# Patient Record
Sex: Female | Born: 1960 | Race: White | Hispanic: No | Marital: Married | Smoking: Never smoker
Health system: Southern US, Community
[De-identification: ages and names within clinical notes are randomized; demographics above are authoritative.]

## PROBLEM LIST (undated history)

## (undated) DIAGNOSIS — T8859XA Other complications of anesthesia, initial encounter: Secondary | ICD-10-CM

## (undated) DIAGNOSIS — Z8719 Personal history of other diseases of the digestive system: Secondary | ICD-10-CM

## (undated) DIAGNOSIS — E7800 Pure hypercholesterolemia, unspecified: Secondary | ICD-10-CM

## (undated) DIAGNOSIS — K5900 Constipation, unspecified: Secondary | ICD-10-CM

## (undated) DIAGNOSIS — T4145XA Adverse effect of unspecified anesthetic, initial encounter: Secondary | ICD-10-CM

## (undated) DIAGNOSIS — G4733 Obstructive sleep apnea (adult) (pediatric): Principal | ICD-10-CM

## (undated) DIAGNOSIS — C439 Malignant melanoma of skin, unspecified: Secondary | ICD-10-CM

## (undated) DIAGNOSIS — S069X9A Unspecified intracranial injury with loss of consciousness of unspecified duration, initial encounter: Secondary | ICD-10-CM

## (undated) DIAGNOSIS — J189 Pneumonia, unspecified organism: Secondary | ICD-10-CM

## (undated) DIAGNOSIS — Z8489 Family history of other specified conditions: Secondary | ICD-10-CM

## (undated) DIAGNOSIS — R011 Cardiac murmur, unspecified: Secondary | ICD-10-CM

## (undated) DIAGNOSIS — H33319 Horseshoe tear of retina without detachment, unspecified eye: Secondary | ICD-10-CM

## (undated) DIAGNOSIS — R55 Syncope and collapse: Secondary | ICD-10-CM

## (undated) DIAGNOSIS — Z9889 Other specified postprocedural states: Secondary | ICD-10-CM

## (undated) DIAGNOSIS — J45909 Unspecified asthma, uncomplicated: Secondary | ICD-10-CM

## (undated) DIAGNOSIS — R112 Nausea with vomiting, unspecified: Secondary | ICD-10-CM

## (undated) HISTORY — DX: Pure hypercholesterolemia, unspecified: E78.00

## (undated) HISTORY — PX: NERVE TRANSFER: SHX2084

## (undated) HISTORY — DX: Syncope and collapse: R55

## (undated) HISTORY — DX: Obstructive sleep apnea (adult) (pediatric): G47.33

## (undated) HISTORY — DX: Malignant melanoma of skin, unspecified: C43.9

## (undated) HISTORY — DX: Unspecified asthma, uncomplicated: J45.909

## (undated) HISTORY — PX: OTHER SURGICAL HISTORY: SHX169

## (undated) HISTORY — PX: ABDOMINAL HYSTERECTOMY: SHX81

## (undated) HISTORY — PX: COLONOSCOPY W/ POLYPECTOMY: SHX1380

## (undated) HISTORY — PX: WRIST SURGERY: SHX841

## (undated) HISTORY — PX: SHOULDER SURGERY: SHX246

---

## 1998-09-18 ENCOUNTER — Other Ambulatory Visit: Admission: RE | Admit: 1998-09-18 | Discharge: 1998-09-18 | Payer: Self-pay | Admitting: Obstetrics and Gynecology

## 1998-10-20 ENCOUNTER — Other Ambulatory Visit: Admission: RE | Admit: 1998-10-20 | Discharge: 1998-10-20 | Payer: Self-pay | Admitting: Obstetrics and Gynecology

## 1999-04-12 ENCOUNTER — Encounter (INDEPENDENT_AMBULATORY_CARE_PROVIDER_SITE_OTHER): Payer: Self-pay

## 1999-04-12 ENCOUNTER — Other Ambulatory Visit: Admission: RE | Admit: 1999-04-12 | Discharge: 1999-04-12 | Payer: Self-pay | Admitting: Obstetrics and Gynecology

## 1999-05-11 ENCOUNTER — Other Ambulatory Visit: Admission: RE | Admit: 1999-05-11 | Discharge: 1999-05-11 | Payer: Self-pay | Admitting: Obstetrics and Gynecology

## 1999-05-11 ENCOUNTER — Encounter (INDEPENDENT_AMBULATORY_CARE_PROVIDER_SITE_OTHER): Payer: Self-pay | Admitting: Specialist

## 1999-08-26 ENCOUNTER — Other Ambulatory Visit: Admission: RE | Admit: 1999-08-26 | Discharge: 1999-08-26 | Payer: Self-pay | Admitting: Obstetrics and Gynecology

## 1999-09-24 ENCOUNTER — Encounter: Admission: RE | Admit: 1999-09-24 | Discharge: 1999-09-24 | Payer: Self-pay | Admitting: Obstetrics and Gynecology

## 1999-09-24 ENCOUNTER — Encounter: Payer: Self-pay | Admitting: Obstetrics and Gynecology

## 2000-03-09 ENCOUNTER — Other Ambulatory Visit: Admission: RE | Admit: 2000-03-09 | Discharge: 2000-03-09 | Payer: Self-pay | Admitting: Obstetrics and Gynecology

## 2000-08-25 ENCOUNTER — Other Ambulatory Visit: Admission: RE | Admit: 2000-08-25 | Discharge: 2000-08-25 | Payer: Self-pay | Admitting: Obstetrics and Gynecology

## 2001-02-21 ENCOUNTER — Other Ambulatory Visit: Admission: RE | Admit: 2001-02-21 | Discharge: 2001-02-21 | Payer: Self-pay | Admitting: Obstetrics and Gynecology

## 2002-04-02 ENCOUNTER — Other Ambulatory Visit: Admission: RE | Admit: 2002-04-02 | Discharge: 2002-04-02 | Payer: Self-pay | Admitting: Obstetrics and Gynecology

## 2002-04-05 ENCOUNTER — Encounter: Admission: RE | Admit: 2002-04-05 | Discharge: 2002-04-05 | Payer: Self-pay | Admitting: Obstetrics and Gynecology

## 2002-04-05 ENCOUNTER — Encounter: Payer: Self-pay | Admitting: Obstetrics and Gynecology

## 2003-04-09 ENCOUNTER — Other Ambulatory Visit: Admission: RE | Admit: 2003-04-09 | Discharge: 2003-04-09 | Payer: Self-pay | Admitting: Obstetrics and Gynecology

## 2004-04-04 ENCOUNTER — Ambulatory Visit (HOSPITAL_BASED_OUTPATIENT_CLINIC_OR_DEPARTMENT_OTHER): Admission: RE | Admit: 2004-04-04 | Discharge: 2004-04-04 | Payer: Self-pay | Admitting: Internal Medicine

## 2004-05-18 ENCOUNTER — Other Ambulatory Visit: Admission: RE | Admit: 2004-05-18 | Discharge: 2004-05-18 | Payer: Self-pay | Admitting: Obstetrics and Gynecology

## 2004-06-09 ENCOUNTER — Ambulatory Visit (HOSPITAL_COMMUNITY): Admission: RE | Admit: 2004-06-09 | Discharge: 2004-06-09 | Payer: Self-pay | Admitting: Endocrinology

## 2005-06-22 ENCOUNTER — Other Ambulatory Visit: Admission: RE | Admit: 2005-06-22 | Discharge: 2005-06-22 | Payer: Self-pay | Admitting: Obstetrics and Gynecology

## 2008-02-26 ENCOUNTER — Ambulatory Visit (HOSPITAL_COMMUNITY): Admission: RE | Admit: 2008-02-26 | Discharge: 2008-02-26 | Payer: Self-pay | Admitting: Obstetrics and Gynecology

## 2009-05-15 ENCOUNTER — Emergency Department (HOSPITAL_COMMUNITY): Admission: EM | Admit: 2009-05-15 | Discharge: 2009-05-15 | Payer: Self-pay | Admitting: Emergency Medicine

## 2010-09-07 LAB — URINALYSIS, ROUTINE W REFLEX MICROSCOPIC
Specific Gravity, Urine: 1.021 (ref 1.005–1.030)
Urobilinogen, UA: 0.2 mg/dL (ref 0.0–1.0)
pH: 6 (ref 5.0–8.0)

## 2010-09-07 LAB — URINE CULTURE

## 2010-09-07 LAB — URINE MICROSCOPIC-ADD ON

## 2010-10-22 NOTE — Procedures (Signed)
NAME:  Darlene Harvey, Darlene Harvey NO.:  [id number]   MEDICAL RECORD NO.:  [id number]          PATIENT TYPE:  OUT   LOCATION:  SLEEP CENTER                 FACILITY:  [facility]   PHYSICIAN:  Clinton D. Maple Hudson, M.D. DATE OF BIRTH:  1960/11/24   DATE OF STUDY:  04/04/2004                              NOCTURNAL POLYSOMNOGRAM   INDICATION FOR STUDY:  Insomnia with sleep apnea.  Multiple sleep  medications have been tried.   EPWORTH SLEEPINESS SCORE:  Epworth score 3/24, BMI 29, weight 150 pounds.   SLEEP ARCHITECTURE:  Total sleep time 339 minutes with a sleep efficiency of  85%.  Stage 1 was 6%, stage 2 was 72%, stages 3 and 4 were 3% and REM was  19% of total sleep time.  Latency to sleep onset 19 minutes, latency to REM  112 minutes.  Awake after sleep onset 38 minutes.  Arousal index 31.9 which  is increased.  Some awakenings were associated with limb jerks but most were  nonspecific.  She described the overall sleep as better than usual, and  estimated her length of sleep at 5 hours.  The patient took temazepam at  study onset.   RESPIRATORY DATA:  RDI 1.9 per hour reflecting 1 central apnea, 1  obstructive apnea and 9 hypopnea's.  This is within normal limits.  Oxygen  saturation was normal with a nadir of 94% and a mean oxygen saturation of  97% on room air. There was mild snoring.   CARDIAC DATA:  Normal cardiac rhythm.   MOVEMENT/PARASOMNIA:  59 limb jerks were recorded of which 8 were associated  with arousal or awakening for a periodic limb movement with a arousable  index of 1.4 per hours which was unremarkable.   IMPRESSION/RECOMMENDATION:  Unremarkable sleep time and architecture except  for somewhat increased nonspecific EEG arousals.  This invites the  possibility that there is sleep state misperception and that she  underestimates the quality of her sleep.  No significant sleep disorder  breathing. Occasionally a limb jerk may wake her. Suggest continued  management as insomnia.                                                           Clinton D. Maple Hudson, M.D.  Diplomate, [employer] Board   CDY/MEDQ  D:  04/11/2004 16:10:96  T:  04/11/2004 18:31:58  Job:  [id number]

## 2011-05-12 ENCOUNTER — Other Ambulatory Visit: Payer: Self-pay | Admitting: Gastroenterology

## 2011-05-12 DIAGNOSIS — R1013 Epigastric pain: Secondary | ICD-10-CM

## 2011-05-17 ENCOUNTER — Ambulatory Visit
Admission: RE | Admit: 2011-05-17 | Discharge: 2011-05-17 | Disposition: A | Discharge status: Home / Self Care | Payer: 59 | Source: Ambulatory Visit | Attending: Gastroenterology | Admitting: Gastroenterology

## 2011-05-17 DIAGNOSIS — R1013 Epigastric pain: Secondary | ICD-10-CM

## 2013-01-14 ENCOUNTER — Other Ambulatory Visit: Payer: Self-pay | Admitting: Obstetrics and Gynecology

## 2013-04-25 ENCOUNTER — Other Ambulatory Visit: Payer: Self-pay | Admitting: Obstetrics and Gynecology

## 2013-06-19 ENCOUNTER — Encounter (INDEPENDENT_AMBULATORY_CARE_PROVIDER_SITE_OTHER): Payer: Self-pay

## 2013-06-19 ENCOUNTER — Encounter: Payer: Self-pay | Admitting: Pulmonary Disease

## 2013-06-19 ENCOUNTER — Ambulatory Visit (INDEPENDENT_AMBULATORY_CARE_PROVIDER_SITE_OTHER): Payer: BC Managed Care – PPO | Admitting: Pulmonary Disease

## 2013-06-19 VITALS — BP 130/74 | HR 83 | Ht 60.0 in | Wt 145.4 lb

## 2013-06-19 DIAGNOSIS — R0989 Other specified symptoms and signs involving the circulatory and respiratory systems: Secondary | ICD-10-CM

## 2013-06-19 DIAGNOSIS — G479 Sleep disorder, unspecified: Secondary | ICD-10-CM

## 2013-06-19 DIAGNOSIS — R0683 Snoring: Secondary | ICD-10-CM

## 2013-06-19 DIAGNOSIS — R0609 Other forms of dyspnea: Secondary | ICD-10-CM

## 2013-06-19 NOTE — Patient Instructions (Signed)
Complete sleep diary Will arrange for sleep study Will call to arrange for follow up after sleep study reviewed

## 2013-06-19 NOTE — Progress Notes (Deleted)
   Subjective:    Patient ID: Darlene Harvey, female    DOB: 1961-03-15, 53 y.o.   MRN: [id number]  HPI    Review of Systems  Constitutional: Negative for fever and unexpected weight change.  HENT: Negative for congestion, dental problem, ear pain, nosebleeds, postnasal drip, rhinorrhea, sinus pressure, sneezing, sore throat and trouble swallowing.   Eyes: Negative for redness and itching.  Respiratory: Positive for chest tightness and shortness of breath. Negative for cough and wheezing.   Cardiovascular: Negative for palpitations and leg swelling.  Gastrointestinal: Negative for nausea and vomiting.  Genitourinary: Negative for dysuria.  Musculoskeletal: Negative for joint swelling.  Skin: Negative for rash.  Neurological: Positive for headaches.  Hematological: Does not bruise/bleed easily.  Psychiatric/Behavioral: Positive for dysphoric mood. The patient is nervous/anxious.        Objective:   Physical Exam        Assessment & Plan:

## 2013-06-19 NOTE — Progress Notes (Signed)
Chief Complaint  Patient presents with  . Sleep consult    Referred by Dr. Gaetano Net  Epworth score 8    History of Present Illness: Darlene Harvey is a 53 y.o. female for evaluation of sleep problems.  She has noticed trouble with her sleep for years.  This started after her youngest daughter was born 29 years ago.  She eventually was able to get into a better sleeping habit after years.  She then needed a hysterectomy with oophorectomy in August 2014 due to fibroids.  She started having trouble with her sleep again.  She was tried on estradiol, but this caused chest heaviness.  She had estradiol stopped, and her chest heaviness has improved.  She is to see cardiology in February 2015.  She can fall asleep easily.  She will sometimes fall asleep on the couch.  Once she is asleep she can wake up easily.  She takes ambien at 9 pm, and this helps.  She will sometimes wake up around 230 am, and take an additional 1/2 pill of ambien.  She has also been using melatonin at night, but this is not as effective as before.  She feels restless at night, and exhausted all day.  She has tried meditation.  She avoids working on computer or watching TV at night.  She will try to read if she as trouble falling back to sleep.  She gets out of bed at 6 am.  She gets hiccups at night, and will sometimes snore.  She will also talk in her sleep and clench her teeth at night.  She sometimes gets back discomfort while asleep.  She will drink a cup of coffee in the morning.  She has noticed feeling more depressed since her TAH with BSO.  She had a sleep study years ago (about 10 to 12 yrs), and was told she had mild sleep apnea but didn't need treatment..  She works as a Environmental education officer, and cares for a quadriplegic patient.  She works from 9 am to 7 pm Tuesday to Friday.  She used to be a professor in disability studies.  She has a history of exercise induced asthma.  She also has history of melanoma s/p excision at  multiple locations.  She denies sleep walking, or nightmares.  There is no history of restless legs.  She denies sleep hallucinations, sleep paralysis, or cataplexy.  The Epworth score is 8 out of 24.  Tests:   Darlene Harvey  has a past medical history of Asthma; High cholesterol; and Cancer.  Darlene Harvey  has past surgical history that includes Abdominal hysterectomy.  Prior to Admission medications   Medication Sig Start Date End Date Taking? Authorizing Provider  calcium carbonate 200 MG capsule Take 250 mg by mouth daily.   Yes Historical Provider, MD  Cholecalciferol (VITAMIN D-3) 5000 UNITS TABS Take 1 tablet by mouth daily.   Yes Historical Provider, MD  Melatonin-Pyridoxine (MELATONEX PO) Take 1 tablet by mouth daily.   Yes Historical Provider, MD    Not on File  Her family history includes Allergies in her mother; Asthma in her mother; Cancer in her mother; Cancer - Prostate in her father.  She  reports that she has never smoked. She does not have any smokeless tobacco history on file. She reports that she does not drink alcohol or use illicit drugs.   Physical Exam:  General - No distress ENT - No sinus tenderness, no oral exudate, no LAN, no thyromegaly, TM  clear, pupils equal/reactive Cardiac - s1s2 regular, no murmur, pulses symmetric Chest - No wheeze/rales/dullness, good air entry, normal respiratory excursion Back - No focal tenderness Abd - Soft, non-tender, no organomegaly, + bowel sounds Ext - No edema Neuro - Normal strength, cranial nerves intact Skin - No rashes Psych - Normal mood, and behavior  Assessment/plan:  Darlene Mires, MD [place] Pager:  [phone] After 3pm call: [phone]

## 2013-07-02 ENCOUNTER — Encounter: Payer: Self-pay | Admitting: Pulmonary Disease

## 2013-07-02 DIAGNOSIS — G4733 Obstructive sleep apnea (adult) (pediatric): Secondary | ICD-10-CM

## 2013-07-02 HISTORY — DX: Obstructive sleep apnea (adult) (pediatric): G47.33

## 2013-07-02 NOTE — Assessment & Plan Note (Signed)
This is likely multifactorial.  She does report snoring, sleep disruption, apnea, and daytime sleepiness.  These symptoms have progressed since she had TAH with BSO.  Certainly incidence of sleep disordered breathing can increase in setting of post-menopausal state.  To further assess will arrange for in lab sleep study.  She also reports teeth clenching and could have bruxism >> further assess during sleep study.  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  This could be from sleep apnea, also.  She is to continue ambien and melatonin for now.  Will have her keep a sleep diary too.  Further assessment will be done after review of her sleep study.  Explained also how depression can contribute to sleep difficulties >> advised her to d/w her PCP further about options to address her symptoms of depression.

## 2013-07-14 ENCOUNTER — Ambulatory Visit (HOSPITAL_BASED_OUTPATIENT_CLINIC_OR_DEPARTMENT_OTHER): Payer: BC Managed Care – PPO | Attending: Pulmonary Disease

## 2013-08-02 ENCOUNTER — Ambulatory Visit (HOSPITAL_BASED_OUTPATIENT_CLINIC_OR_DEPARTMENT_OTHER): Payer: BC Managed Care – PPO | Attending: Pulmonary Disease | Admitting: Radiology

## 2013-08-02 VITALS — Ht 60.0 in | Wt 145.0 lb

## 2013-08-02 DIAGNOSIS — R0683 Snoring: Secondary | ICD-10-CM

## 2013-08-02 DIAGNOSIS — G4730 Sleep apnea, unspecified: Secondary | ICD-10-CM | POA: Insufficient documentation

## 2013-08-04 ENCOUNTER — Telehealth: Payer: Self-pay | Admitting: Pulmonary Disease

## 2013-08-04 DIAGNOSIS — R0989 Other specified symptoms and signs involving the circulatory and respiratory systems: Secondary | ICD-10-CM

## 2013-08-04 DIAGNOSIS — R0609 Other forms of dyspnea: Secondary | ICD-10-CM

## 2013-08-04 NOTE — Telephone Encounter (Signed)
PSG 08/02/13 >> RDI 13.8, SaO2 low 90%.  Will have my nurse inform pt that she has mild sleep apnea, and will need ROV to discuss in more detail.

## 2013-08-04 NOTE — Sleep Study (Signed)
[place]  NAME: Darlene Harvey DATE OF BIRTH:  06/14/1960 MEDICAL RECORD NUMBER [id number]  LOCATION: [facility]  PHYSICIAN: Chesley Mires, M.D. DATE OF STUDY: 08/02/2013  SLEEP STUDY TYPE: Nocturnal Polysomnogram               REFERRING PHYSICIAN: Chesley Mires, MD  INDICATION FOR STUDY:  53 year old female with snoring, sleep disruption, and daytime sleepiness.  She has a history of bruxism and sleep maintenance insomnia.  She is referred to the sleep lab to further assess cause of her hypersomnolence.  EPWORTH SLEEPINESS SCORE: 7. HEIGHT: 5' (152.4 cm)  WEIGHT: 145 lb (65.772 kg)    Body mass index is 28.32 kg/(m^2).  NECK SIZE: 12.5 in.  MEDICATIONS:  Current Outpatient Prescriptions on File Prior to Visit  Medication Sig Dispense Refill  . calcium carbonate 200 MG capsule Take 250 mg by mouth daily.      . Cholecalciferol (VITAMIN D-3) 5000 UNITS TABS Take 1 tablet by mouth daily.      . Melatonin-Pyridoxine (MELATONEX PO) Take 1 tablet by mouth daily.      Marland Kitchen zolpidem (AMBIEN) 5 MG tablet Take 5 mg by mouth at bedtime as needed for sleep.       No current facility-administered medications on file prior to visit.    SLEEP ARCHITECTURE:  Total recording time: 393.5 minutes.  Total sleep time was: 292.5 minutes.  Sleep efficiency: 74.2%.  Sleep latency: 8.5 minutes.  REM latency: 138.5 minutes.  Stage N1: 19%.  Stage N2: 74%.  Stage N3: 0.2%.  Stage R:  6.8%.  Supine sleep: 78 minutes.  Non-supine sleep: 214 minutes.  RESPIRATORY DATA: Average respiratory rate: 15. Snoring: moderate. Average AHI: 1.4.  Average RDI: 13.8.   Apnea index: 0.2.  Hypopnea index: 1.2. Obstructive apnea index: 0.  Central apnea index: 0.2.  Mixed apnea index: 0. REM AHI: 9.  NREM AHI: 0.9. Supine AHI: 2.2. Non-supine AHI: 0.5.  OXYGEN DATA:  Baseline oxygenation: 95%. Lowest SaO2: 90%. Time spent below SaO2 90%: 0 minutes. Supplemental oxygen  used: none.  CARDIAC DATA:  Average heart rate: 67 beats per minute. Rhythm strip: sinus rhythm with occasional PAC's.  MOVEMENT/PARASOMNIA:  Periodic limb movement: 0.  Period limb movements with arousals: 0. Restroom trips: None.  IMPRESSION/ RECOMMENDATION:   This study shows mild sleep apnea with an RDI of 13.8 and SaO2 low of 90%.   Additional therapies include weight loss, CPAP, oral appliance, or surgical evaluation.   Chesley Mires, M.D. Diplomate, [employer] of Sleep Medicine  ELECTRONICALLY SIGNED ON:  08/04/2013, 12:14 PM [place] PH: [phone]   FX: [phone] [place]

## 2013-08-05 NOTE — Telephone Encounter (Signed)
Pt is aware of sleep study results. ROV has been scheduled for 08/26/13 at 11:15am.

## 2013-08-26 ENCOUNTER — Encounter: Payer: Self-pay | Admitting: Pulmonary Disease

## 2013-08-26 ENCOUNTER — Ambulatory Visit (INDEPENDENT_AMBULATORY_CARE_PROVIDER_SITE_OTHER): Payer: BC Managed Care – PPO | Admitting: Pulmonary Disease

## 2013-08-26 VITALS — BP 130/88 | HR 83 | Temp 97.4°F | Ht 60.0 in | Wt 150.4 lb

## 2013-08-26 DIAGNOSIS — G4733 Obstructive sleep apnea (adult) (pediatric): Secondary | ICD-10-CM

## 2013-08-26 DIAGNOSIS — G4700 Insomnia, unspecified: Secondary | ICD-10-CM

## 2013-08-26 DIAGNOSIS — F458 Other somatoform disorders: Secondary | ICD-10-CM

## 2013-08-26 NOTE — Patient Instructions (Signed)
Will arrange for CPAP set up  Follow up in 2 months after CPAP set up 

## 2013-08-26 NOTE — Assessment & Plan Note (Signed)
She has mild sleep apnea.  I have reviewed the recent sleep study results with the patient.  We discussed how sleep apnea can affect various health problems including risks for hypertension, cardiovascular disease, and diabetes.  We also discussed how sleep disruption can increase risks for accident, such as while driving.  Weight loss as a means of improving sleep apnea was also reviewed.  Additional treatment options discussed were CPAP therapy, oral appliance, and surgical intervention.  Will arrange for auto CPAP setup. 

## 2013-08-26 NOTE — Assessment & Plan Note (Signed)
Explained how her "sleep maintenance" insomnia could actually be related to sleep apnea.  Defer restarting ambien for now.

## 2013-08-26 NOTE — Progress Notes (Signed)
Chief Complaint  Patient presents with  . Follow-up    Review of sleep study. Pt states she recently had a stress test and echo preformed.     History of Present Illness: Darlene Harvey is a 53 y.o. female with OSA.  She also has sleep maintenance insomnia and bruxism.  She is here to review her sleep study.  This showed mild sleep apnea.  TESTS: PSG 08/02/13 >> RDI 13.8, SaO2 low 90%.  Darlene Harvey  has a past medical history of Asthma; High cholesterol; and Melanoma.  Darlene Harvey  has past surgical history that includes Abdominal hysterectomy.  Prior to Admission medications   Medication Sig Start Date End Date Taking? Authorizing Provider  Black Cohosh 540 MG CAPS Take 540 mg by mouth daily.   Yes Historical Provider, MD  calcium carbonate 200 MG capsule Take 250 mg by mouth daily.   Yes Historical Provider, MD  Cholecalciferol (VITAMIN D-3) 5000 UNITS TABS Take 1 tablet by mouth daily.   Yes Historical Provider, MD  Melatonin-Pyridoxine (MELATONEX PO) Take 1 tablet by mouth daily as needed.    Yes Historical Provider, MD  zolpidem (AMBIEN) 5 MG tablet Take 5 mg by mouth at bedtime as needed for sleep.   Yes Historical Provider, MD    Allergies  Allergen Reactions  . Iodinated Diagnostic Agents   . Penicillins   . Sulfa Antibiotics      Physical Exam:  General - No distress ENT - No sinus tenderness, no oral exudate, no LAN Cardiac - s1s2 regular, no murmur Chest - No wheeze/rales/dullness Back - No focal tenderness Abd - Soft, non-tender Ext - No edema Neuro - Normal strength Skin - No rashes Psych - normal mood, and behavior   Assessment/Plan:  Darlene Mires, MD [place] [facility]/Critical Care/Sleep Pager:  [phone]

## 2013-08-26 NOTE — Assessment & Plan Note (Signed)
Explained how this can be related to sleep apnea.  Reassess after she is established on therapy for sleep apnea.

## 2013-09-02 ENCOUNTER — Telehealth: Payer: Self-pay | Admitting: Pulmonary Disease
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
## 2013-09-03 NOTE — Telephone Encounter (Signed)
Pt called & said she is to let nurse know that [facility] called her.

## 2013-12-18 ENCOUNTER — Ambulatory Visit: Payer: BC Managed Care – PPO | Admitting: Pulmonary Disease

## 2014-01-13 ENCOUNTER — Encounter: Payer: Self-pay | Admitting: Pulmonary Disease

## 2014-01-13 ENCOUNTER — Encounter (INDEPENDENT_AMBULATORY_CARE_PROVIDER_SITE_OTHER): Payer: Self-pay

## 2014-01-13 ENCOUNTER — Ambulatory Visit (INDEPENDENT_AMBULATORY_CARE_PROVIDER_SITE_OTHER): Payer: BC Managed Care – PPO | Admitting: Pulmonary Disease

## 2014-01-13 VITALS — BP 130/82 | HR 81 | Temp 98.2°F | Ht 60.0 in | Wt 160.4 lb

## 2014-01-13 DIAGNOSIS — F458 Other somatoform disorders: Secondary | ICD-10-CM

## 2014-01-13 DIAGNOSIS — G4733 Obstructive sleep apnea (adult) (pediatric): Secondary | ICD-10-CM

## 2014-01-13 NOTE — Progress Notes (Signed)
Chief Complaint  Patient presents with  . Follow-up    Pt states she is wearing her CPAP nightly for 7-8 hours. Pt states she is waking during the night d/t a recent shoulder sx, pt states she might be waking d/t pain. Pt denies issues with machine, pressure or mask. Pt states she is swallowing a lot of air.     History of Present Illness: Darlene Harvey is a 53 y.o. female with OSA.  She also has sleep maintenance insomnia and bruxism.  She is surprised at how much using CPAP has helped.  Her main problem has been related to aerophagia.  She has nasal mask.  She had injury to her shoulder.  She was taking pain medication.  Since this her sleep pattern has been disrupted.  TESTS: PSG 08/02/13 >> RDI 13.8, SaO2 low 90%.  PMHx, PSHx, Medications, Allergies, Fhx, Shx reviewed.   Physical Exam:  General - No distress ENT - No sinus tenderness, no oral exudate, no LAN Cardiac - s1s2 regular, no murmur Chest - No wheeze/rales/dullness Back - No focal tenderness Abd - Soft, non-tender Ext - No edema, Rt arm in wrap Neuro - Normal strength Skin - No rashes Psych - normal mood, and behavior   Assessment/Plan:  Chesley Mires, MD [place] [facility]/Critical Care/Sleep Pager:  [phone]

## 2014-01-13 NOTE — Patient Instructions (Signed)
Follow up in 6 months 

## 2014-01-16 NOTE — Assessment & Plan Note (Signed)
She is compliant with CPAP and reports benefit.    Discussed techniques to help overcome aerophagia.

## 2014-01-27 ENCOUNTER — Telehealth: Payer: Self-pay | Admitting: Pulmonary Disease

## 2014-01-27 DIAGNOSIS — Z9989 Dependence on other enabling machines and devices: Principal | ICD-10-CM

## 2014-01-27 DIAGNOSIS — G4733 Obstructive sleep apnea (adult) (pediatric): Secondary | ICD-10-CM

## 2014-01-27 NOTE — Telephone Encounter (Signed)
Auto CPAP 12/28/13 to 01/26/14 >> used on 30 of 30 nights with average 7 hrs and 19 min.  Average AHI is 0.8 with median CPAP 8 cm H2O and 95 th percentile CPAP 11 cm H20.  Will have my nurse inform pt that CPAP report shows very good control of sleep apnea.  Will have her DME adjust her auto CPAP setting down to 5 to 10 cm H2O.  She should call back if she continues to have trouble with swallowing air while using CPAP.

## 2014-01-27 NOTE — Telephone Encounter (Signed)
Report printed from [facility] and given to VS. Will forward back to VS to advise.

## 2014-01-27 NOTE — Telephone Encounter (Signed)
I have not received her download yet.  Can you please arrange for this to be sent from her DME.

## 2014-01-27 NOTE — Telephone Encounter (Signed)
Spoke with the pt  She was seen on 01/13/14 and states that VS advised he would review recent CPAP DL and call her to let her know if any changes were needed  She is requesting update on this Please advise, thanks!

## 2014-01-29 NOTE — Telephone Encounter (Signed)
lmomtcb for pt - DME order already placed by Dr. Halford Chessman.

## 2014-01-29 NOTE — Telephone Encounter (Signed)
Pt advised. Darlene Harvey, CMA  

## 2014-02-13 ENCOUNTER — Other Ambulatory Visit: Payer: Self-pay | Admitting: Obstetrics and Gynecology

## 2014-02-14 LAB — CYTOLOGY - PAP

## 2014-12-23 ENCOUNTER — Ambulatory Visit (INDEPENDENT_AMBULATORY_CARE_PROVIDER_SITE_OTHER): Payer: BC Managed Care – PPO | Admitting: Adult Health

## 2014-12-23 ENCOUNTER — Encounter (INDEPENDENT_AMBULATORY_CARE_PROVIDER_SITE_OTHER): Payer: Self-pay

## 2014-12-23 ENCOUNTER — Encounter: Payer: Self-pay | Admitting: Adult Health

## 2014-12-23 VITALS — BP 130/88 | HR 78 | Temp 98.3°F | Ht 60.0 in | Wt 166.0 lb

## 2014-12-23 DIAGNOSIS — G4700 Insomnia, unspecified: Secondary | ICD-10-CM

## 2014-12-23 DIAGNOSIS — G4733 Obstructive sleep apnea (adult) (pediatric): Secondary | ICD-10-CM

## 2014-12-23 NOTE — Progress Notes (Signed)
   Subjective:    Patient ID: Darlene Harvey, female    DOB: 1960-11-21, 54 y.o.   MRN: [id number]  HPI 54 yo female with OSA   TESTS  PSG 08/02/13 >> RDI 13.8, SaO2 low 90%.   12/23/2014 Follow up OSA-Cynara Tatham NP  Pt returns for 6 month follow up for OSA On nocturnal CPAP .  Doing well with mask . No issues  Download shows good compliance.  avg usage 5.5 hr /night . AHI 0.9 .  Says CPAP has really helped her.  Does have ongoing insomnia .  Says she goes to sleep good but wakes up in middle of night Feels it is related to her hormones with associated hot flashes.  Does get sleepy at times.  Has used melatonin in past with some help.  Previously tried other sleep aides with hangover effect in am.  Denies chest pain, orthopnea, edema or fever.    Review of Systems Constitutional:   No  weight loss, night sweats,  Fevers, chills,  +fatigue, or  lassitude.  HEENT:   No headaches,  Difficulty swallowing,  Tooth/dental problems, or  Sore throat,                No sneezing, itching, ear ache, nasal congestion, post nasal drip,   CV:  No chest pain,  Orthopnea, PND, swelling in lower extremities, anasarca, dizziness, palpitations, syncope.   GI  No heartburn, indigestion, abdominal pain, nausea, vomiting, diarrhea, change in bowel habits, loss of appetite, bloody stools.   Resp: No shortness of breath with exertion or at rest.  No excess mucus, no productive cough,  No non-productive cough,  No coughing up of blood.  No change in color of mucus.  No wheezing.  No chest wall deformity  Skin: no rash or lesions.  GU: no dysuria, change in color of urine, no urgency or frequency.  No flank pain, no hematuria   MS:  No joint pain or swelling.  No decreased range of motion.  No back pain.  Psych:  No change in mood or affect. No depression or anxiety.  No memory loss.         Objective:   Physical Exam GEN: A/Ox3; pleasant , NAD, well nourished   HEENT:  [place]/AT,  EACs-clear,  TMs-wnl, NOSE-clear, THROAT-clear, no lesions, no postnasal drip or exudate noted. Class 2 airway   NECK:  Supple w/ fair ROM; no JVD; normal carotid impulses w/o bruits; no thyromegaly or nodules palpated; no lymphadenopathy.  RESP  Clear  P & A; w/o, wheezes/ rales/ or rhonchi.no accessory muscle use, no dullness to percussion  CARD:  RRR, no m/r/g  , no peripheral edema, pulses intact, no cyanosis or clubbing.  GI:   Soft & nt; nml bowel sounds; no organomegaly or masses detected.  Musco: Warm bil, no deformities or joint swelling noted.   Neuro: alert, no focal deficits noted.    Skin: Warm, no lesions or rashes         Assessment & Plan:

## 2014-12-23 NOTE — Patient Instructions (Signed)
Keep up good work.  Wear CPAP At bedtime   Do not drive if sleepy.  May try melatonin for insomnia , must wear CPAP if taking.  Follow up Dr. Halford Chessman  In  1 year and As needed

## 2014-12-23 NOTE — Assessment & Plan Note (Signed)
Doing well on CPAP  Discussed wt loss  Plan  Keep up good work.  Wear CPAP At bedtime   Do not drive if sleepy.  May try melatonin for insomnia , must wear CPAP if taking.  Follow up Dr. Halford Chessman  In  1 year and As needed

## 2014-12-23 NOTE — Assessment & Plan Note (Signed)
Discussed healthy sleep hygiene  May try melatonin if on CPAP -use with caution

## 2014-12-23 NOTE — Progress Notes (Signed)
Reviewed and agree with assessment/plan. 

## 2015-01-26 ENCOUNTER — Encounter: Payer: Self-pay | Admitting: Adult Health

## 2016-04-05 ENCOUNTER — Encounter: Payer: Self-pay | Admitting: Pulmonary Disease

## 2016-04-05 ENCOUNTER — Ambulatory Visit (INDEPENDENT_AMBULATORY_CARE_PROVIDER_SITE_OTHER): Payer: BC Managed Care – PPO | Admitting: Pulmonary Disease

## 2016-04-05 VITALS — BP 134/68 | HR 77 | Ht 60.0 in | Wt 168.8 lb

## 2016-04-05 DIAGNOSIS — Z9989 Dependence on other enabling machines and devices: Secondary | ICD-10-CM

## 2016-04-05 DIAGNOSIS — G4733 Obstructive sleep apnea (adult) (pediatric): Secondary | ICD-10-CM

## 2016-04-05 NOTE — Patient Instructions (Signed)
Can look up CPAP mask options at [email or web address]  Will arrange for CPAP mask refitting  Follow up in 3 months

## 2016-04-05 NOTE — Progress Notes (Signed)
No current outpatient prescriptions on file prior to visit.   No current facility-administered medications on file prior to visit.      Chief Complaint  Patient presents with  . Follow-up    1 yr OSA follow up - reports some claustrophobia with her mask/CPAP x1 year and has not been wearing her CPAP     Sleep tests PSG 08/02/13 >> AHI 13.8, SpO2 low 90%  Past medical history Asthma, HLD, Melanoma, Vasomotor instability  Past surgical history, Family history, Social history, Allergies all reviewed.  Vital Signs BP 134/68 (BP Location: Left Arm, Cuff Size: Normal)   Pulse 77   Ht 5' (1.524 m)   Wt 168 lb 12.8 oz (76.6 kg)   SpO2 100%   BMI 32.97 kg/m   History of Present Illness Darlene Harvey is a 55 y.o. female with obstructive sleep apnea.  She was last seen in 2015.  She was able use CPAP nightly.  She got a new mask and this didn't fit well.  She was feeling claustrophobic.  She feels better when she can use CPAP.  She still wasn't sleeping through the night.  She feels tired throughout the day.  She snores and stops breathing while asleep.  She feels her sleep problems, daytime fatigue, and mood problems all started after she had hysterectomy with oophorectomy.   Physical Exam  General - No distress ENT - No sinus tenderness, no oral exudate, no LAN, MP 3 Cardiac - s1s2 regular, no murmur Chest - No wheeze/rales/dullness Back - No focal tenderness Abd - Soft, non-tender Ext - No edema Neuro - Normal strength Skin - No rashes Psych - normal mood, and behavior   Assessment/Plan  Obstructive sleep apnea. - will arrange for refitting of her CPAP mask - if she is not able to adjust to using CPAP, then might need to consider oral appliance  Sleep maintenance insomnia. - re-assess after she is established on therapy for her sleep apnea   Patient Instructions  Can look up CPAP mask options at [email or web address]  Will arrange for CPAP mask refitting  Follow up in 3  months    Chesley Mires, MD [place] [facility]/Critical Care/Sleep Pager:  [phone] 04/05/2016, 4:05 PM

## 2016-07-18 ENCOUNTER — Ambulatory Visit (INDEPENDENT_AMBULATORY_CARE_PROVIDER_SITE_OTHER): Payer: BC Managed Care – PPO | Admitting: Pulmonary Disease

## 2016-07-18 ENCOUNTER — Encounter: Payer: Self-pay | Admitting: Pulmonary Disease

## 2016-07-18 VITALS — BP 122/78 | HR 79 | Ht 60.0 in | Wt 156.0 lb

## 2016-07-18 DIAGNOSIS — Z9989 Dependence on other enabling machines and devices: Secondary | ICD-10-CM

## 2016-07-18 DIAGNOSIS — G4722 Circadian rhythm sleep disorder, advanced sleep phase type: Secondary | ICD-10-CM

## 2016-07-18 DIAGNOSIS — G4733 Obstructive sleep apnea (adult) (pediatric): Secondary | ICD-10-CM

## 2016-07-18 NOTE — Patient Instructions (Signed)
Go to bed at Midnight  Use light box at 8 pm - use 5,000 to 10,000 Lux for 10 to 15 minutes.  Sit 5 feet away from light box.  Follow up in 2 months

## 2016-07-18 NOTE — Progress Notes (Signed)
No current outpatient prescriptions on file prior to visit.   No current facility-administered medications on file prior to visit.      Chief Complaint  Patient presents with  . Follow-up    Pt states that she is unable to sleep. Pt states that it does not matter if she uses the CPAP or not, she can never sleep more than 4 hours. DME: [employer]      Sleep tests PSG 08/02/13 >> AHI 13.8, SpO2 low 90% Auto CPAP 04/19/16 to 07/17/16 >> used on 38 of 90 nights with average 5 hrs 10 min.  Average AHI 0.9 with median CPAP 6 and 95 th percentile CPAP 7 cm H2O  Past medical history Asthma, HLD, Melanoma, Vasomotor instability  Past surgical history, Family history, Social history, Allergies all reviewed.  Vital Signs BP 122/78 (BP Location: Left Arm, Cuff Size: Normal)   Pulse 79   Ht 5' (1.524 m)   Wt 156 lb (70.8 kg)   SpO2 97%   BMI 30.47 kg/m   History of Present Illness Darlene Harvey is a 56 y.o. female with obstructive sleep apnea.  She feels CPAP is working okay.  No issues with mask fit.  She can't stay asleep during the night.  She starts getting sleepy around 7 pm.  She is usually in bed by 9 pm, and no trouble falling asleep.  She sleeps until 1 am, and then can't go back to sleep.  She has tried several prescription and OTC medications before.  She has reviewed sleep hygiene, and is trying to be consistent with this.  She doesn't feel like her mood is an issue, except that she is always tired.   Physical Exam  General - pleasant ENT - no sinus tenderness, MP 3 Cardiac - regular, no murmur Chest - no wheeze, rales Back - no tenderness Abd - soft, non tender Ext - no edema Neuro - normal strength Skin - no rashes Psych - normal mood   Assessment/Plan  Advanced sleep phase. - I think this might be why she is having trouble staying asleep - advised her to go to bed at midnight - she can try light therapy - discussed proper sleep hygiene  Obstructive sleep  apnea. - advised her to use CPAP for entire time she is asleep - continue auto CPAP  Time spent 34 minutes  Patient Instructions  Go to bed at Midnight  Use light box at 8 pm - use 5,000 to 10,000 Lux for 10 to 15 minutes.  Sit 5 feet away from light box.  Follow up in 2 months    Chesley Mires, MD [facility] Pager:  [phone] 07/18/2016, 9:31 AM

## 2016-09-26 ENCOUNTER — Ambulatory Visit: Payer: BC Managed Care – PPO | Admitting: Pulmonary Disease

## 2016-12-01 ENCOUNTER — Encounter (HOSPITAL_COMMUNITY): Payer: Self-pay

## 2016-12-01 ENCOUNTER — Emergency Department (HOSPITAL_COMMUNITY)
Admission: EM | Admit: 2016-12-01 | Discharge: 2016-12-01 | Disposition: A | Discharge status: Home / Self Care | Payer: BC Managed Care – PPO | Attending: Emergency Medicine | Admitting: Emergency Medicine

## 2016-12-01 DIAGNOSIS — L500 Allergic urticaria: Secondary | ICD-10-CM | POA: Insufficient documentation

## 2016-12-01 DIAGNOSIS — R21 Rash and other nonspecific skin eruption: Secondary | ICD-10-CM | POA: Diagnosis present

## 2016-12-01 DIAGNOSIS — J45909 Unspecified asthma, uncomplicated: Secondary | ICD-10-CM | POA: Diagnosis not present

## 2016-12-01 DIAGNOSIS — Z85828 Personal history of other malignant neoplasm of skin: Secondary | ICD-10-CM | POA: Insufficient documentation

## 2016-12-01 DIAGNOSIS — Z79899 Other long term (current) drug therapy: Secondary | ICD-10-CM | POA: Diagnosis not present

## 2016-12-01 DIAGNOSIS — T7840XA Allergy, unspecified, initial encounter: Secondary | ICD-10-CM

## 2016-12-01 MED ORDER — PREDNISONE 20 MG PO TABS: 40.0000 mg | ORAL_TABLET | Freq: Every day | ORAL | 0 refills | Status: DC

## 2016-12-01 MED ORDER — DIPHENHYDRAMINE HCL 50 MG/ML IJ SOLN
25.0000 mg | Freq: Once | INTRAMUSCULAR | Status: AC
  Administered 2016-12-01: 25 mg via INTRAVENOUS
  Filled 2016-12-01: qty 1

## 2016-12-01 MED ORDER — METHYLPREDNISOLONE SODIUM SUCC 125 MG IJ SOLR
125.0000 mg | Freq: Once | INTRAMUSCULAR | Status: AC
  Administered 2016-12-01: 125 mg via INTRAVENOUS
  Filled 2016-12-01: qty 2

## 2016-12-01 MED ORDER — FAMOTIDINE IN NACL 20-0.9 MG/50ML-% IV SOLN
20.0000 mg | Freq: Once | INTRAVENOUS | Status: AC
  Administered 2016-12-01: 20 mg via INTRAVENOUS
  Filled 2016-12-01: qty 50

## 2016-12-01 MED ORDER — EPINEPHRINE 0.3 MG/0.3ML IJ SOAJ: 0.3000 mg | Freq: Once | INTRAMUSCULAR | 0 refills | Status: AC | PRN

## 2016-12-01 NOTE — Discharge Instructions (Signed)
Take benadryl and pepcid for the next 2-3 days. Take steroids until finished. Return to ER immediately if any worsening symptoms.

## 2016-12-01 NOTE — ED Triage Notes (Signed)
EDP Little present evaluating pt upon arrival to [facility]. Generalized rash x 1 week. Benadryl at 0700. Pt c/o of swelling lips. Denies trouble breathing or swallowing.

## 2016-12-01 NOTE — ED Provider Notes (Signed)
[facility] DEPT Provider Note   CSN: [id number] Arrival date & time: 12/01/16  1014     History   Chief Complaint Chief Complaint  Patient presents with  . Allergic Reaction    HPI Darlene Harvey is a 56 y.o. female.  56yo F w/ PMH including melanoma, HLD, asthma who p/w rash. Pt is 2 weeks post-op from bladder reconstruction with sling. At post-op visit on 6/19, foley catheter was removed and she was given macrobid for suspected UTI. She took her last dose of medication 4 days ago and on that same day began developing hives on her body. She has been taking benadryl and using calamine gel with moderate relief. This morning, she woke up and noted that the hives were much worse and she also developed lip swelling. She took one Benadryl at 7 AM. She reports severe itching. She denies any breathing difficulty, swelling inside her mouth, difficulty swallowing, vomiting, diarrhea, or chest pain. She denies any skin problems in mouth or groin. No fevers. She has a lot of postoperative swelling but denies any problems with healing. She denies any other new medications, new soaps/lotions/detergents, or potential food exposures. No outdoor/tick exposure.   The history is provided by the patient.  Allergic Reaction    Past Medical History:  Diagnosis Date  . Asthma   . High cholesterol   . Melanoma ([place])   . OSA (obstructive sleep apnea) 07/02/2013  . Vasomotor instability    Hypotension with exercise    Patient Active Problem List   Diagnosis Date Noted  . Insomnia 08/26/2013  . 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 OSA (obstructive sleep apnea) 07/02/2013    Past Surgical History:  Procedure Laterality Date  . ABDOMINAL HYSTERECTOMY      OB History    No data available       Home Medications    Prior to Admission medications   Medication Sig Start Date End Date Taking? Authorizing Provider  Albuterol Sulfate (PROAIR RESPICLICK) 591 (90 Base) MCG/ACT AEPB Inhale 2 puffs into the  lungs every 6 (six) hours as needed (for wheezing/shortness of breath).   Yes [provider]  conjugated estrogens (PREMARIN) vaginal cream Place 1 Applicatorful vaginally every other day.   Yes [provider]  VIMOVO 500-20 MG TBEC Take 1 tablet by mouth 2 (two) times daily as needed (for pain).    Yes [provider]  EPINEPHrine (EPIPEN 2-PAK) 0.3 mg/0.3 mL IJ SOAJ injection Inject 0.3 mLs (0.3 mg total) into the muscle once as needed (for severe allergic reaction). CAll 911 immediately if you have to use this medicine 12/01/16   [name], [name], MD  predniSONE (DELTASONE) 20 MG tablet Take 2 tablets (40 mg total) by mouth daily. 12/01/16   [name], [name], MD    Family History Family History  Problem Relation Age of Onset  . Asthma Mother   . Allergies Mother   . Cancer Mother        stomach  . Cancer - Prostate Father     Social History Social History  Substance Use Topics  . Smoking status: Never Smoker  . Smokeless tobacco: Never Used  . Alcohol use No     Comment: 1-2 weekly     Allergies   Bee venom; Ciprofloxacin; Metrizamide; Penicillins; Red dye; Sulfa antibiotics; Iodinated diagnostic agents; and Ketoconazole   Review of Systems Review of Systems All other systems reviewed and are negative except that which was mentioned in HPI   Physical Exam  Updated Vital Signs BP 106/74 (BP Location: Right Arm)   Pulse 75   Temp 98.1 F (36.7 C) (Oral)   Resp 16   Ht 5' (1.524 m)   Wt 69.4 kg (153 lb)   SpO2 93%   BMI 29.88 kg/m   Physical Exam  Constitutional: She is oriented to person, place, and time. She appears well-developed and well-nourished. No distress.  Uncomfortable, anxious, laying on side  HENT:  Head: Normocephalic and atraumatic.  Mouth/Throat: Oropharynx is clear and moist.  Very mild upper lip swelling, no tongue swelling, Moist mucous membranes  Eyes: Conjunctivae are normal. Pupils are equal, round, and  reactive to light.  Neck: Neck supple.  Cardiovascular: Normal rate, regular rhythm and normal heart sounds.   No murmur heard. Pulmonary/Chest: Effort normal and breath sounds normal. She has no wheezes.  Abdominal: Soft. Bowel sounds are normal. She exhibits no distension. There is no tenderness.  Musculoskeletal: She exhibits no edema.  Neurological: She is alert and oriented to person, place, and time.  Fluent speech  Skin: Skin is warm and dry. Rash noted.  Diffuse circular urticaria involving extremities and trunk, blanching, no mucous membrane involvement  Psychiatric: Judgment normal.  anxious  Nursing note and vitals reviewed.    ED Treatments / Results  Labs (all labs ordered are listed, but only abnormal results are displayed) Labs Reviewed - No data to display  EKG  EKG Interpretation None       Radiology No results found.  Procedures Procedures (including critical care time)  Medications Ordered in ED Medications  methylPREDNISolone sodium succinate (SOLU-MEDROL) 125 mg/2 mL injection 125 mg (125 mg Intravenous Given 12/01/16 1037)  diphenhydrAMINE (BENADRYL) injection 25 mg (25 mg Intravenous Given 12/01/16 1037)  famotidine (PEPCID) IVPB 20 mg premix (0 mg Intravenous Stopped 12/01/16 1107)     Initial Impression / Assessment and Plan / ED Course  I have reviewed the triage vital signs and the nursing notes.  Pertinent labs & imaging results that were available during my care of the patient were reviewed by me and considered in my medical decision making (see chart for details).    PT w/ pruritic, urticarial Rash that started 4 days ago and has worsened today, now with mild lip swelling. On arrival, she had normal vital signs, normal work of breathing, no respiratory complaints. She did have diffuse urticaria which did not appear to have any mucous membrane involvement. No vomiting or other complaints to suggest anaphylaxis especially given that the rash  has been 4 days in duration. Gave Solu-Medrol, Benadryl, Pepcid.  Patient observed for several hours during which time her hives significantly improved. On reexamination, she stated that she felt much better and she felt her lip swelling was also improving. It is unclear what she reacted to that steroids and Benadryl appear to be helping her symptoms. I provided her with epipen, steroid course, and structures to continue Benadryl and Pepcid at home. She will follow-up with PCP as needed. She understands return precautions. Patient discharged in satisfactory condition.  Final Clinical Impressions(s) / ED Diagnoses   Final diagnoses:  Allergic reaction, initial encounter    New Prescriptions Discharge Medication List as of 12/01/2016  1:55 PM    START taking these medications   Details  EPINEPHrine (EPIPEN 2-PAK) 0.3 mg/0.3 mL IJ SOAJ injection Inject 0.3 mLs (0.3 mg total) into the muscle once as needed (for severe allergic reaction). CAll 911 immediately if you have to use this medicine, Starting Thu  12/01/2016, Print    predniSONE (DELTASONE) 20 MG tablet Take 2 tablets (40 mg total) by mouth daily., Starting Thu 12/01/2016, Print         Daishon Chui, [name], MD 12/01/16 1536

## 2016-12-01 NOTE — ED Notes (Signed)
ED Provider at bedside. 

## 2016-12-12 ENCOUNTER — Ambulatory Visit: Payer: BC Managed Care – PPO | Admitting: Pulmonary Disease

## 2018-02-28 ENCOUNTER — Encounter (INDEPENDENT_AMBULATORY_CARE_PROVIDER_SITE_OTHER): Payer: BC Managed Care – PPO | Admitting: Ophthalmology

## 2018-02-28 DIAGNOSIS — H4312 Vitreous hemorrhage, left eye: Secondary | ICD-10-CM | POA: Diagnosis not present

## 2018-02-28 DIAGNOSIS — H43813 Vitreous degeneration, bilateral: Secondary | ICD-10-CM | POA: Diagnosis not present

## 2018-02-28 DIAGNOSIS — H2513 Age-related nuclear cataract, bilateral: Secondary | ICD-10-CM | POA: Diagnosis not present

## 2018-03-29 ENCOUNTER — Emergency Department (HOSPITAL_COMMUNITY): Payer: Worker's Compensation

## 2018-03-29 ENCOUNTER — Emergency Department (HOSPITAL_COMMUNITY)
Admission: EM | Admit: 2018-03-29 | Discharge: 2018-03-29 | Disposition: A | Discharge status: Home / Self Care | Payer: Worker's Compensation | Attending: Emergency Medicine | Admitting: Emergency Medicine

## 2018-03-29 DIAGNOSIS — S93401A Sprain of unspecified ligament of right ankle, initial encounter: Secondary | ICD-10-CM | POA: Insufficient documentation

## 2018-03-29 DIAGNOSIS — Y999 Unspecified external cause status: Secondary | ICD-10-CM | POA: Insufficient documentation

## 2018-03-29 DIAGNOSIS — Y9301 Activity, walking, marching and hiking: Secondary | ICD-10-CM | POA: Diagnosis not present

## 2018-03-29 DIAGNOSIS — S99911A Unspecified injury of right ankle, initial encounter: Secondary | ICD-10-CM | POA: Diagnosis present

## 2018-03-29 DIAGNOSIS — J45909 Unspecified asthma, uncomplicated: Secondary | ICD-10-CM | POA: Diagnosis not present

## 2018-03-29 DIAGNOSIS — Z79899 Other long term (current) drug therapy: Secondary | ICD-10-CM | POA: Insufficient documentation

## 2018-03-29 DIAGNOSIS — W108XXA Fall (on) (from) other stairs and steps, initial encounter: Secondary | ICD-10-CM | POA: Diagnosis not present

## 2018-03-29 DIAGNOSIS — Y92480 Sidewalk as the place of occurrence of the external cause: Secondary | ICD-10-CM | POA: Diagnosis not present

## 2018-03-29 DIAGNOSIS — S82832A Other fracture of upper and lower end of left fibula, initial encounter for closed fracture: Secondary | ICD-10-CM

## 2018-03-29 MED ORDER — OXYCODONE-ACETAMINOPHEN 5-325 MG PO TABS
1.0000 | ORAL_TABLET | Freq: Once | ORAL | Status: AC
  Administered 2018-03-29: 1 via ORAL
  Filled 2018-03-29: qty 1

## 2018-03-29 MED ORDER — OXYCODONE-ACETAMINOPHEN 5-325 MG PO TABS: 1.0000 | ORAL_TABLET | Freq: Four times a day (QID) | ORAL | 0 refills | Status: DC | PRN

## 2018-03-29 MED ORDER — HYDROMORPHONE HCL 1 MG/ML IJ SOLN
1.0000 mg | Freq: Once | INTRAMUSCULAR | Status: AC
  Administered 2018-03-29: 1 mg via INTRAVENOUS
  Filled 2018-03-29: qty 1

## 2018-03-29 MED ORDER — ONDANSETRON HCL 4 MG/2ML IJ SOLN
4.0000 mg | Freq: Once | INTRAMUSCULAR | Status: AC
  Administered 2018-03-29: 4 mg via INTRAVENOUS
  Filled 2018-03-29: qty 2

## 2018-03-29 NOTE — ED Notes (Signed)
Patient transported to X-ray 

## 2018-03-29 NOTE — ED Notes (Signed)
Bed: [facility] Expected date:  Expected time:  Means of arrival:  Comments: EMS-fall 

## 2018-03-29 NOTE — Discharge Instructions (Signed)
You have severely sprained your right ankle and suffered a fibular fracture of your left ankle.  Please obtain wheel chair to aid with movement.  Take percocet as needed for pain.  Call and follow up closely with your orthopedist doctor for further management of your injury. Keep your ankles elevated when resting.

## 2018-03-29 NOTE — ED Provider Notes (Signed)
[facility] DEPT Provider Note   CSN: [id number] Arrival date & time: 03/29/18  1817     History   Chief Complaint Chief Complaint  Patient presents with  . Ankle Pain    HPI Darlene Harvey is a 57 y.o. female.  The history is provided by the patient. No language interpreter was used.  Ankle Pain   Pertinent negatives include no numbness.     57 year old female presenting for evaluation of right ankle injury.  Patient was brought here via EMS.  Patient report she was leaving to go home from work, walked on the steps, may have missed stepped and fell down approximately 3 steps.  She injured her ankles in the process.  She felt a pop and snapped with severe pain to her right ankle as well as left ankle.  Pain is persistent, improved with rest and worsening with movement.  She was unable to ambulate afterward.  She denies hitting her head or loss of consciousness.  She denies any knee or hip pain.  She did receive fentanyl prior to arrival via EMS but states that the pain has since returned.  She denies any precipitating symptoms prior to the fall.  Past Medical History:  Diagnosis Date  . Asthma   . High cholesterol   . Melanoma ([place])   . OSA (obstructive sleep apnea) 07/02/2013  . Vasomotor instability    Hypotension with exercise    Patient Active Problem List   Diagnosis Date Noted  . Insomnia 08/26/2013  . 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 OSA (obstructive sleep apnea) 07/02/2013    Past Surgical History:  Procedure Laterality Date  . ABDOMINAL HYSTERECTOMY       OB History   None      Home Medications    Prior to Admission medications   Medication Sig Start Date End Date Taking? Authorizing Provider  Albuterol Sulfate (PROAIR RESPICLICK) 850 (90 Base) MCG/ACT AEPB Inhale 2 puffs into the lungs every 6 (six) hours as needed (for wheezing/shortness of breath).    [provider]  conjugated estrogens (PREMARIN) vaginal cream Place  1 Applicatorful vaginally every other day.    [provider]  EPINEPHrine (EPIPEN 2-PAK) 0.3 mg/0.3 mL IJ SOAJ injection Inject 0.3 mLs (0.3 mg total) into the muscle once as needed (for severe allergic reaction). CAll 911 immediately if you have to use this medicine 12/01/16   Little, [name], MD  predniSONE (DELTASONE) 20 MG tablet Take 2 tablets (40 mg total) by mouth daily. 12/01/16   Little, [name], MD  VIMOVO 500-20 MG TBEC Take 1 tablet by mouth 2 (two) times daily as needed (for pain).     [provider]    Family History Family History  Problem Relation Age of Onset  . Asthma Mother   . Allergies Mother   . Cancer Mother        stomach  . Cancer - Prostate Father     Social History Social History   Tobacco Use  . Smoking status: Never Smoker  . Smokeless tobacco: Never Used  Substance Use Topics  . Alcohol use: No    Comment: 1-2 weekly  . Drug use: No     Allergies   Bee venom; Ciprofloxacin; Metrizamide; Penicillins; Red dye; Sulfa antibiotics; Iodinated diagnostic agents; and Ketoconazole   Review of Systems Review of Systems  Constitutional: Negative for fever.  Musculoskeletal: Positive for arthralgias.  Skin: Negative for wound.  Neurological: Negative for numbness.  Physical Exam Updated Vital Signs BP (!) 150/88 (BP Location: Right Arm)   Pulse 82   Temp 98.2 F (36.8 C) (Oral)   Resp 18   SpO2 95%   Physical Exam  Constitutional: She appears well-developed and well-nourished. No distress.  HENT:  Head: Atraumatic.  Eyes: Conjunctivae are normal.  Neck: Neck supple.  Cardiovascular: Normal rate and regular rhythm.  Pulmonary/Chest: Effort normal and breath sounds normal.  Abdominal: Soft. There is no tenderness.  Musculoskeletal: She exhibits tenderness (Right ankle: Exquisite tenderness to lateral malleolus with obvious closed deformity but no skin tenting.  Decreased ankle range of motion secondary to  pain.  Dorsalis pedis pulse palpable, brisk cap refill, no tenderness to fifth metatarsal.).  Left ankle: Moderate tenderness to lateral malleolus without deformity or skin tenting.  Decreased ankle range of motion secondary to pain.  Dorsalis pedis pulse palpable, brisk cap refill, no tenderness to fifth metatarsal  Bilateral knees and hips are nontender.   Neurological: She is alert.  Skin: Capillary refill takes less than 2 seconds. No rash noted.  Psychiatric: She has a normal mood and affect.  Nursing note and vitals reviewed.    ED Treatments / Results  Labs (all labs ordered are listed, but only abnormal results are displayed) Labs Reviewed - No data to display  EKG None  Radiology Dg Ankle Complete Left  Result Date: 03/29/2018 CLINICAL DATA:  [place] today and injured left ankle. EXAM: LEFT ANKLE COMPLETE - 3+ VIEW COMPARISON:  None. FINDINGS: There is a long oblique fracture involving the distal fibular shaft at and above the level of the ankle mortise. No significant displacement. No tibial fractures identified. The ankle mortise is maintained. The talus is intact. IMPRESSION: Long oblique fracture of the distal fibula. Electronically Signed   By: Marijo Sanes M.D.   On: 03/29/2018 19:59   Dg Ankle Complete Right  Result Date: 03/29/2018 CLINICAL DATA:  [place].  Left ankle pain. EXAM: RIGHT ANKLE - COMPLETE 3+ VIEW COMPARISON:  None. FINDINGS: Extensive soft tissue swelling but no acute ankle fracture. The ankle mortise is maintained. The mid and hindfoot bony structures appear intact. IMPRESSION: No acute fractures. Electronically Signed   By: Marijo Sanes M.D.   On: 03/29/2018 20:00    Procedures Procedures (including critical care time)  Medications Ordered in ED Medications  HYDROmorphone (DILAUDID) injection 1 mg (1 mg Intravenous Given 03/29/18 2012)  ondansetron (ZOFRAN) injection 4 mg (4 mg Intravenous Given 03/29/18 2012)     Initial Impression / Assessment  and Plan / ED Course  I have reviewed the triage vital signs and the nursing notes.  Pertinent labs & imaging results that were available during my care of the patient were reviewed by me and considered in my medical decision making (see chart for details).     BP (!) 136/91 (BP Location: Right Arm)   Pulse 76   Temp 98.2 F (36.8 C) (Oral)   Resp 18   SpO2 97%    Final Clinical Impressions(s) / ED Diagnoses   Final diagnoses:  Sprain of right ankle, unspecified ligament, initial encounter  Closed fracture of distal end of left fibula, unspecified fracture morphology, initial encounter    ED Discharge Orders         Ordered    oxyCODONE-acetaminophen (PERCOCET) 5-325 MG tablet  Every 6 hours PRN     03/29/18 2245    For home use only DME wheelchair cushion (seat and back)     03/29/18  2245         Patient suffered a mechanical fall while at work.  She injured both of her ankles after falling down several steps.  She was unable to bear weight.  Significant pain is primarily to her right ankle.  X-ray of both ankle was obtained.  Right ankle shows extensive soft tissue swelling but no evidence of acute fractures.  Right ankle is neurovascular intact.  Left ankle shows a long oblique fracture of the distal fibula.  She is also neurovascularly intact on the left ankle and foot.  A cam walker was placed in the right ankle, a posterior splint was placed in the left ankle.  Patient will need to have access to a wheelchair for mobility.  A request for durable medical equipment for wheelchair has been placed.  Patient requests to be managed by orthopedist Dr. Ninfa Linden, referral given.  Patient discharged home with Percocet. In order to decrease risk of narcotic abuse. Pt's record were checked using the [place] Controlled Substance database.     Domenic Moras, PA-C 03/29/18 2249    Dorie Rank, MD 03/31/18 1046

## 2018-03-29 NOTE — ED Triage Notes (Addendum)
Pt BIB [facility] from home. She was walking down her steps at home and was on the second to last step and rolled her ankle. Fell to her knees. Her right lateral malleolus is swollen and no other signs of trauma. PMS in tact. Denies LOC. 100 mcg of Fentanyl and 4mg  of Zofran given en route to ED.

## 2018-04-02 ENCOUNTER — Encounter (HOSPITAL_COMMUNITY): Payer: Self-pay | Admitting: *Deleted

## 2018-04-02 ENCOUNTER — Other Ambulatory Visit: Payer: Self-pay

## 2018-04-02 ENCOUNTER — Other Ambulatory Visit: Payer: Self-pay | Admitting: Orthopaedic Surgery

## 2018-04-02 ENCOUNTER — Ambulatory Visit (INDEPENDENT_AMBULATORY_CARE_PROVIDER_SITE_OTHER): Payer: BC Managed Care – PPO | Admitting: Physician Assistant

## 2018-04-02 NOTE — Progress Notes (Signed)
Darlene Harvey denies chest pain or shortness of breath.  Patient is non weight bearing, she has a boot on right leg and a cast on left. Darlene Harvey reports a  History of a heart murmer- it was evaluated by Dr Einar Gip in 2015. Patient reports that she did not have to follow up with him.  I showed the 2D Echo to Karoline Caldwell, PA, no new orders.

## 2018-04-03 ENCOUNTER — Ambulatory Visit (HOSPITAL_COMMUNITY)
Admission: RE | Admit: 2018-04-03 | Discharge: 2018-04-05 | Disposition: A | Discharge status: Home / Self Care | Payer: No Typology Code available for payment source | Source: Ambulatory Visit | Attending: Orthopaedic Surgery | Admitting: Orthopaedic Surgery

## 2018-04-03 ENCOUNTER — Ambulatory Visit (HOSPITAL_COMMUNITY): Payer: No Typology Code available for payment source | Admitting: Certified Registered Nurse Anesthetist

## 2018-04-03 ENCOUNTER — Encounter (HOSPITAL_COMMUNITY): Payer: Self-pay

## 2018-04-03 ENCOUNTER — Ambulatory Visit (HOSPITAL_COMMUNITY): Payer: No Typology Code available for payment source

## 2018-04-03 ENCOUNTER — Encounter (HOSPITAL_COMMUNITY)
Admission: RE | Disposition: A | Discharge status: Home / Self Care | Payer: Self-pay | Source: Ambulatory Visit | Attending: Orthopaedic Surgery

## 2018-04-03 DIAGNOSIS — Z9989 Dependence on other enabling machines and devices: Secondary | ICD-10-CM | POA: Insufficient documentation

## 2018-04-03 DIAGNOSIS — Y990 Civilian activity done for income or pay: Secondary | ICD-10-CM | POA: Insufficient documentation

## 2018-04-03 DIAGNOSIS — Z91030 Bee allergy status: Secondary | ICD-10-CM | POA: Diagnosis not present

## 2018-04-03 DIAGNOSIS — J45909 Unspecified asthma, uncomplicated: Secondary | ICD-10-CM | POA: Diagnosis not present

## 2018-04-03 DIAGNOSIS — Z881 Allergy status to other antibiotic agents status: Secondary | ICD-10-CM | POA: Insufficient documentation

## 2018-04-03 DIAGNOSIS — Z7982 Long term (current) use of aspirin: Secondary | ICD-10-CM | POA: Insufficient documentation

## 2018-04-03 DIAGNOSIS — Z883 Allergy status to other anti-infective agents status: Secondary | ICD-10-CM | POA: Insufficient documentation

## 2018-04-03 DIAGNOSIS — Z880 Allergy status to penicillin: Secondary | ICD-10-CM | POA: Insufficient documentation

## 2018-04-03 DIAGNOSIS — Z882 Allergy status to sulfonamides status: Secondary | ICD-10-CM | POA: Insufficient documentation

## 2018-04-03 DIAGNOSIS — W109XXA Fall (on) (from) unspecified stairs and steps, initial encounter: Secondary | ICD-10-CM | POA: Diagnosis not present

## 2018-04-03 DIAGNOSIS — S82892A Other fracture of left lower leg, initial encounter for closed fracture: Secondary | ICD-10-CM

## 2018-04-03 DIAGNOSIS — Z79899 Other long term (current) drug therapy: Secondary | ICD-10-CM | POA: Insufficient documentation

## 2018-04-03 DIAGNOSIS — E7800 Pure hypercholesterolemia, unspecified: Secondary | ICD-10-CM | POA: Diagnosis not present

## 2018-04-03 DIAGNOSIS — Z888 Allergy status to other drugs, medicaments and biological substances status: Secondary | ICD-10-CM | POA: Insufficient documentation

## 2018-04-03 DIAGNOSIS — Z85820 Personal history of malignant melanoma of skin: Secondary | ICD-10-CM | POA: Diagnosis not present

## 2018-04-03 DIAGNOSIS — S8262XA Displaced fracture of lateral malleolus of left fibula, initial encounter for closed fracture: Secondary | ICD-10-CM | POA: Diagnosis not present

## 2018-04-03 DIAGNOSIS — G4733 Obstructive sleep apnea (adult) (pediatric): Secondary | ICD-10-CM | POA: Insufficient documentation

## 2018-04-03 DIAGNOSIS — Z885 Allergy status to narcotic agent status: Secondary | ICD-10-CM | POA: Insufficient documentation

## 2018-04-03 DIAGNOSIS — Z419 Encounter for procedure for purposes other than remedying health state, unspecified: Secondary | ICD-10-CM

## 2018-04-03 HISTORY — DX: Unspecified intracranial injury with loss of consciousness of unspecified duration, initial encounter: S06.9X9A

## 2018-04-03 HISTORY — DX: Cardiac murmur, unspecified: R01.1

## 2018-04-03 HISTORY — PX: ORIF ANKLE FRACTURE: SUR919

## 2018-04-03 HISTORY — DX: Other fracture of left lower leg, initial encounter for closed fracture: S82.892A

## 2018-04-03 HISTORY — DX: Personal history of other diseases of the digestive system: Z87.19

## 2018-04-03 HISTORY — DX: Adverse effect of unspecified anesthetic, initial encounter: T41.45XA

## 2018-04-03 HISTORY — DX: Other specified postprocedural states: Z98.890

## 2018-04-03 HISTORY — DX: Horseshoe tear of retina without detachment, unspecified eye: H33.319

## 2018-04-03 HISTORY — DX: Constipation, unspecified: K59.00

## 2018-04-03 HISTORY — DX: Nausea with vomiting, unspecified: R11.2

## 2018-04-03 HISTORY — DX: Family history of other specified conditions: Z84.89

## 2018-04-03 HISTORY — DX: Pneumonia, unspecified organism: J18.9

## 2018-04-03 HISTORY — PX: ORIF ANKLE FRACTURE: SHX5408

## 2018-04-03 HISTORY — DX: Other complications of anesthesia, initial encounter: T88.59XA

## 2018-04-03 SURGERY — OPEN REDUCTION INTERNAL FIXATION (ORIF) ANKLE FRACTURE
Anesthesia: Monitor Anesthesia Care | Site: Ankle | Laterality: Left

## 2018-04-03 MED ORDER — ONDANSETRON HCL 4 MG/2ML IJ SOLN
INTRAMUSCULAR | Status: DC | PRN
  Administered 2018-04-03: 4 mg via INTRAVENOUS

## 2018-04-03 MED ORDER — ONDANSETRON HCL 4 MG PO TABS
4.0000 mg | ORAL_TABLET | Freq: Four times a day (QID) | ORAL | Status: DC | PRN
  Administered 2018-04-04: 4 mg via ORAL
  Filled 2018-04-03: qty 1

## 2018-04-03 MED ORDER — PHENYLEPHRINE 40 MCG/ML (10ML) SYRINGE FOR IV PUSH (FOR BLOOD PRESSURE SUPPORT)
PREFILLED_SYRINGE | INTRAVENOUS | Status: AC
  Filled 2018-04-03: qty 10

## 2018-04-03 MED ORDER — ONDANSETRON HCL 4 MG/2ML IJ SOLN
INTRAMUSCULAR | Status: AC
  Filled 2018-04-03: qty 2

## 2018-04-03 MED ORDER — LACTATED RINGERS IV SOLN
INTRAVENOUS | Status: DC
  Administered 2018-04-03: 09:00:00 via INTRAVENOUS

## 2018-04-03 MED ORDER — PHENYLEPHRINE 40 MCG/ML (10ML) SYRINGE FOR IV PUSH (FOR BLOOD PRESSURE SUPPORT)
PREFILLED_SYRINGE | INTRAVENOUS | Status: DC | PRN
  Administered 2018-04-03: 120 ug via INTRAVENOUS
  Administered 2018-04-03: 80 ug via INTRAVENOUS

## 2018-04-03 MED ORDER — CLINDAMYCIN PHOSPHATE 900 MG/50ML IV SOLN
900.0000 mg | INTRAVENOUS | Status: AC
  Administered 2018-04-03: 900 mg via INTRAVENOUS
  Filled 2018-04-03: qty 50

## 2018-04-03 MED ORDER — OXYCODONE HCL 5 MG PO TABS: 5.0000 mg | ORAL_TABLET | Freq: Four times a day (QID) | ORAL | 0 refills | Status: AC | PRN

## 2018-04-03 MED ORDER — BUPIVACAINE-EPINEPHRINE (PF) 0.5% -1:200000 IJ SOLN
INTRAMUSCULAR | Status: DC | PRN
  Administered 2018-04-03: 30 mL via PERINEURAL

## 2018-04-03 MED ORDER — ACETAMINOPHEN 325 MG PO TABS
325.0000 mg | ORAL_TABLET | Freq: Four times a day (QID) | ORAL | Status: DC | PRN
  Administered 2018-04-03: 325 mg via ORAL
  Filled 2018-04-03: qty 1

## 2018-04-03 MED ORDER — METOCLOPRAMIDE HCL 5 MG PO TABS
5.0000 mg | ORAL_TABLET | Freq: Three times a day (TID) | ORAL | Status: DC | PRN
  Administered 2018-04-04 – 2018-04-05 (×3): 5 mg via ORAL
  Filled 2018-04-03: qty 2
  Filled 2018-04-03 (×2): qty 1

## 2018-04-03 MED ORDER — FENTANYL CITRATE (PF) 100 MCG/2ML IJ SOLN
INTRAMUSCULAR | Status: DC | PRN
  Administered 2018-04-03: 50 ug via INTRAVENOUS

## 2018-04-03 MED ORDER — FENTANYL CITRATE (PF) 100 MCG/2ML IJ SOLN
25.0000 ug | INTRAMUSCULAR | Status: DC | PRN
  Administered 2018-04-03: 50 ug via INTRAVENOUS

## 2018-04-03 MED ORDER — MORPHINE SULFATE (PF) 2 MG/ML IV SOLN: 1.0000 mg | INTRAVENOUS | Status: DC | PRN

## 2018-04-03 MED ORDER — LIDOCAINE 2% (20 MG/ML) 5 ML SYRINGE
INTRAMUSCULAR | Status: DC | PRN
  Administered 2018-04-03: 20 mg via INTRAVENOUS

## 2018-04-03 MED ORDER — PROPOFOL 500 MG/50ML IV EMUL
INTRAVENOUS | Status: DC | PRN
  Administered 2018-04-03: 75 ug/kg/min via INTRAVENOUS

## 2018-04-03 MED ORDER — MELOXICAM 15 MG PO TABS: 15.0000 mg | ORAL_TABLET | Freq: Every day | ORAL | 11 refills | Status: AC

## 2018-04-03 MED ORDER — FENTANYL CITRATE (PF) 100 MCG/2ML IJ SOLN: 50.0000 ug | Freq: Once | INTRAMUSCULAR | Status: DC

## 2018-04-03 MED ORDER — PROMETHAZINE HCL 25 MG/ML IJ SOLN: 6.2500 mg | INTRAMUSCULAR | Status: DC | PRN

## 2018-04-03 MED ORDER — DEXAMETHASONE SODIUM PHOSPHATE 10 MG/ML IJ SOLN
INTRAMUSCULAR | Status: AC
  Filled 2018-04-03: qty 1

## 2018-04-03 MED ORDER — MIDAZOLAM HCL 5 MG/5ML IJ SOLN
INTRAMUSCULAR | Status: DC | PRN
  Administered 2018-04-03: 1 mg via INTRAVENOUS

## 2018-04-03 MED ORDER — EPHEDRINE SULFATE-NACL 50-0.9 MG/10ML-% IV SOSY
PREFILLED_SYRINGE | INTRAVENOUS | Status: DC | PRN
  Administered 2018-04-03: 10 mg via INTRAVENOUS

## 2018-04-03 MED ORDER — SODIUM CHLORIDE 0.9 % IV SOLN
INTRAVENOUS | Status: DC | PRN
  Administered 2018-04-03: 25 ug/min via INTRAVENOUS

## 2018-04-03 MED ORDER — FENTANYL CITRATE (PF) 250 MCG/5ML IJ SOLN
INTRAMUSCULAR | Status: AC
  Filled 2018-04-03: qty 5

## 2018-04-03 MED ORDER — FENTANYL CITRATE (PF) 100 MCG/2ML IJ SOLN
100.0000 ug | Freq: Once | INTRAMUSCULAR | Status: AC
  Administered 2018-04-03: 100 ug via INTRAVENOUS

## 2018-04-03 MED ORDER — MIDAZOLAM HCL 2 MG/2ML IJ SOLN
INTRAMUSCULAR | Status: AC
  Filled 2018-04-03: qty 2

## 2018-04-03 MED ORDER — SCOPOLAMINE 1 MG/3DAYS TD PT72
1.0000 | MEDICATED_PATCH | TRANSDERMAL | Status: DC
  Administered 2018-04-03: 1.5 mg via TRANSDERMAL
  Filled 2018-04-03: qty 1

## 2018-04-03 MED ORDER — DEXAMETHASONE SODIUM PHOSPHATE 10 MG/ML IJ SOLN
INTRAMUSCULAR | Status: DC | PRN
  Administered 2018-04-03: 10 mg via INTRAVENOUS

## 2018-04-03 MED ORDER — EPHEDRINE 5 MG/ML INJ
INTRAVENOUS | Status: AC
  Filled 2018-04-03: qty 10

## 2018-04-03 MED ORDER — FENTANYL CITRATE (PF) 100 MCG/2ML IJ SOLN
INTRAMUSCULAR | Status: AC
  Administered 2018-04-03: 100 ug via INTRAVENOUS
  Filled 2018-04-03: qty 2

## 2018-04-03 MED ORDER — OXYCODONE HCL 5 MG PO TABS
5.0000 mg | ORAL_TABLET | ORAL | Status: DC | PRN
  Administered 2018-04-03 – 2018-04-05 (×6): 5 mg via ORAL
  Filled 2018-04-03 (×5): qty 1
  Filled 2018-04-03: qty 2
  Filled 2018-04-03: qty 1

## 2018-04-03 MED ORDER — PROPOFOL 10 MG/ML IV BOLUS
INTRAVENOUS | Status: AC
  Filled 2018-04-03: qty 20

## 2018-04-03 MED ORDER — METOCLOPRAMIDE HCL 5 MG/ML IJ SOLN
5.0000 mg | Freq: Three times a day (TID) | INTRAMUSCULAR | Status: DC | PRN
  Administered 2018-04-04: 10 mg via INTRAVENOUS
  Filled 2018-04-03: qty 2

## 2018-04-03 MED ORDER — ASPIRIN 325 MG PO TABS: 325.0000 mg | ORAL_TABLET | Freq: Every day | ORAL | 11 refills | Status: AC

## 2018-04-03 MED ORDER — 0.9 % SODIUM CHLORIDE (POUR BTL) OPTIME
TOPICAL | Status: DC | PRN
  Administered 2018-04-03: 1000 mL

## 2018-04-03 MED ORDER — FENTANYL CITRATE (PF) 100 MCG/2ML IJ SOLN
INTRAMUSCULAR | Status: AC
  Filled 2018-04-03: qty 2

## 2018-04-03 MED ORDER — CLINDAMYCIN PHOSPHATE 600 MG/50ML IV SOLN
600.0000 mg | Freq: Four times a day (QID) | INTRAVENOUS | Status: AC
  Administered 2018-04-03 – 2018-04-04 (×3): 600 mg via INTRAVENOUS
  Filled 2018-04-03 (×3): qty 50

## 2018-04-03 MED ORDER — MIDAZOLAM HCL 2 MG/2ML IJ SOLN
INTRAMUSCULAR | Status: AC
  Administered 2018-04-03: 2 mg via INTRAVENOUS
  Filled 2018-04-03: qty 2

## 2018-04-03 MED ORDER — LIDOCAINE 2% (20 MG/ML) 5 ML SYRINGE
INTRAMUSCULAR | Status: AC
  Filled 2018-04-03: qty 5

## 2018-04-03 MED ORDER — MIDAZOLAM HCL 2 MG/2ML IJ SOLN
2.0000 mg | Freq: Once | INTRAMUSCULAR | Status: AC
  Administered 2018-04-03: 2 mg via INTRAVENOUS

## 2018-04-03 MED ORDER — ONDANSETRON HCL 4 MG/2ML IJ SOLN
4.0000 mg | Freq: Four times a day (QID) | INTRAMUSCULAR | Status: DC | PRN
  Administered 2018-04-03: 4 mg via INTRAVENOUS
  Filled 2018-04-03: qty 2

## 2018-04-03 SURGICAL SUPPLY — 67 items
ALCOHOL 70% 16 OZ (MISCELLANEOUS) ×3 IMPLANT
BANDAGE ACE 4X5 VEL STRL LF (GAUZE/BANDAGES/DRESSINGS) ×3 IMPLANT
BANDAGE ESMARK 6X9 LF (GAUZE/BANDAGES/DRESSINGS) ×1 IMPLANT
BIT DRILL 2.5X110 QC LCP DISP (BIT) ×3 IMPLANT
BIT DRILL PERC QC 2.8X200 100 (BIT) ×1 IMPLANT
BIT DRILL QC 3.5X110 (BIT) ×3 IMPLANT
BLADE SURG 15 STRL LF DISP TIS (BLADE) ×1 IMPLANT
BLADE SURG 15 STRL SS (BLADE) ×2
BNDG COHESIVE 4X5 TAN STRL (GAUZE/BANDAGES/DRESSINGS) ×3 IMPLANT
BNDG COHESIVE 6X5 TAN STRL LF (GAUZE/BANDAGES/DRESSINGS) ×3 IMPLANT
BNDG ELASTIC 6X10 VLCR STRL LF (GAUZE/BANDAGES/DRESSINGS) ×3 IMPLANT
BNDG ESMARK 6X9 LF (GAUZE/BANDAGES/DRESSINGS) ×3
CANISTER SUCT 3000ML PPV (MISCELLANEOUS) ×3 IMPLANT
CHLORAPREP W/TINT 26ML (MISCELLANEOUS) ×6 IMPLANT
COVER SURGICAL LIGHT HANDLE (MISCELLANEOUS) ×3 IMPLANT
COVER WAND RF STERILE (DRAPES) ×3 IMPLANT
CUFF TOURNIQUET SINGLE 34IN LL (TOURNIQUET CUFF) ×3 IMPLANT
CUFF TOURNIQUET SINGLE 44IN (TOURNIQUET CUFF) IMPLANT
DRAPE OEC MINIVIEW 54X84 (DRAPES) ×3 IMPLANT
DRAPE U-SHAPE 47X51 STRL (DRAPES) ×3 IMPLANT
DRILL BIT QUICK COUP 2.8MM 100 (BIT) ×2
DRSG MEPITEL 4X7.2 (GAUZE/BANDAGES/DRESSINGS) ×3 IMPLANT
DRSG PAD ABDOMINAL 8X10 ST (GAUZE/BANDAGES/DRESSINGS) ×6 IMPLANT
ELECT REM PT RETURN 9FT ADLT (ELECTROSURGICAL) ×3
ELECTRODE REM PT RTRN 9FT ADLT (ELECTROSURGICAL) ×1 IMPLANT
GAUZE SPONGE 4X4 12PLY STRL (GAUZE/BANDAGES/DRESSINGS) ×3 IMPLANT
GAUZE XEROFORM 5X9 LF (GAUZE/BANDAGES/DRESSINGS) ×3 IMPLANT
GLOVE BIO SURGEON STRL SZ7.5 (GLOVE) ×3 IMPLANT
GLOVE BIOGEL PI IND STRL 8 (GLOVE) ×1 IMPLANT
GLOVE BIOGEL PI INDICATOR 8 (GLOVE) ×2
GLOVE ECLIPSE 8.0 STRL XLNG CF (GLOVE) ×3 IMPLANT
GOWN STRL REUS W/ TWL LRG LVL3 (GOWN DISPOSABLE) ×1 IMPLANT
GOWN STRL REUS W/ TWL XL LVL3 (GOWN DISPOSABLE) ×2 IMPLANT
GOWN STRL REUS W/TWL LRG LVL3 (GOWN DISPOSABLE) ×2
GOWN STRL REUS W/TWL XL LVL3 (GOWN DISPOSABLE) ×4
KIT BASIN OR (CUSTOM PROCEDURE TRAY) ×3 IMPLANT
KIT TURNOVER KIT B (KITS) ×3 IMPLANT
NS IRRIG 1000ML POUR BTL (IV SOLUTION) ×3 IMPLANT
PACK ORTHO EXTREMITY (CUSTOM PROCEDURE TRAY) ×3 IMPLANT
PAD ABD 8X10 STRL (GAUZE/BANDAGES/DRESSINGS) ×3 IMPLANT
PAD ARMBOARD 7.5X6 YLW CONV (MISCELLANEOUS) ×6 IMPLANT
PAD CAST 4YDX4 CTTN HI CHSV (CAST SUPPLIES) ×1 IMPLANT
PADDING CAST COTTON 4X4 STRL (CAST SUPPLIES) ×2
PADDING CAST COTTON 6X4 STRL (CAST SUPPLIES) ×3 IMPLANT
PLATE LCP 3.5 1/3 TUB 6HX69 (Plate) ×3 IMPLANT
SCREW CANC FT ST SFS 4X16 (Screw) ×3 IMPLANT
SCREW CORTEX 3.5 12MM (Screw) ×4 IMPLANT
SCREW CORTEX 3.5 14MM (Screw) ×2 IMPLANT
SCREW CORTEX 3.5 18MM (Screw) ×2 IMPLANT
SCREW LOCK CORT ST 3.5X12 (Screw) ×2 IMPLANT
SCREW LOCK CORT ST 3.5X14 (Screw) ×1 IMPLANT
SCREW LOCK CORT ST 3.5X18 (Screw) ×1 IMPLANT
SCREW LOCK T15 FT 12X3.5X2.9X (Screw) ×1 IMPLANT
SCREW LOCKING 3.5X12 (Screw) ×2 IMPLANT
SPLINT PLASTER CAST XFAST 5X30 (CAST SUPPLIES) ×1 IMPLANT
SPLINT PLASTER XFAST SET 5X30 (CAST SUPPLIES) ×2
SPONGE LAP 18X18 X RAY DECT (DISPOSABLE) ×3 IMPLANT
SUCTION FRAZIER HANDLE 10FR (MISCELLANEOUS) ×2
SUCTION TUBE FRAZIER 10FR DISP (MISCELLANEOUS) ×1 IMPLANT
SUT ETHILON 3 0 PS 1 (SUTURE) ×3 IMPLANT
SUT MNCRL AB 3-0 PS2 18 (SUTURE) IMPLANT
SUT VIC AB 2-0 CT1 27 (SUTURE) ×4
SUT VIC AB 2-0 CT1 TAPERPNT 27 (SUTURE) ×2 IMPLANT
TOWEL OR 17X24 6PK STRL BLUE (TOWEL DISPOSABLE) ×3 IMPLANT
TOWEL OR 17X26 10 PK STRL BLUE (TOWEL DISPOSABLE) ×3 IMPLANT
TUBE CONNECTING 12'X1/4 (SUCTIONS) ×1
TUBE CONNECTING 12X1/4 (SUCTIONS) ×2 IMPLANT

## 2018-04-03 NOTE — Progress Notes (Signed)
Pt arrived to room 5N12 via bed after surgery. Received report from [place], [place] in PACU. See assessment. Will continue to monitor.

## 2018-04-03 NOTE — Anesthesia Procedure Notes (Signed)
Procedure Name: MAC Date/Time: 04/03/2018 9:26 AM Performed by: Colin Benton, CRNA Pre-anesthesia Checklist: Patient identified, Emergency Drugs available, Suction available and Patient being monitored Patient Re-evaluated:Patient Re-evaluated prior to induction Oxygen Delivery Method: Simple face mask Induction Type: IV induction Placement Confirmation: positive ETCO2 Dental Injury: Teeth and Oropharynx as per pre-operative assessment

## 2018-04-03 NOTE — Anesthesia Procedure Notes (Signed)
Anesthesia Regional Block: Popliteal block   Pre-Anesthetic Checklist: ,, timeout performed, Correct Patient, Correct Site, Correct Laterality, Correct Procedure, Correct Position, site marked, Risks and benefits discussed,  Surgical consent,  Pre-op evaluation,  At surgeon's request and post-op pain management  Laterality: Left  Prep: chloraprep       Needles:  Injection technique: Single-shot  Needle Type: Echogenic Stimulator Needle          Additional Needles:   Narrative:  Start time: 04/03/2018 8:47 AM End time: 04/03/2018 8:57 AM Injection made incrementally with aspirations every 5 mL.  Performed by: Personally  Anesthesiologist: Duane Boston, MD  Additional Notes: A functioning IV was confirmed and monitors were applied.  Sterile prep and drape, hand hygiene and sterile gloves were used.  Negative aspiration and test dose prior to incremental administration of local anesthetic. The patient tolerated the procedure well.Ultrasound  guidance: relevant anatomy identified, needle position confirmed, local anesthetic spread visualized around nerve(s), vascular puncture avoided.  Image printed for medical record. ADC supplementation.

## 2018-04-03 NOTE — Anesthesia Preprocedure Evaluation (Addendum)
Anesthesia Evaluation  Patient identified by MRN, date of birth, ID band Patient awake    Reviewed: Allergy & Precautions, NPO status , Patient's Chart, lab work & pertinent test results  History of Anesthesia Complications (+) PONV and history of anesthetic complications  Airway Mallampati: II  TM Distance: >3 FB Neck ROM: Full    Dental no notable dental hx. (+) Dental Advisory Given   Pulmonary asthma , sleep apnea ,    Pulmonary exam normal        Cardiovascular negative cardio ROS Normal cardiovascular exam     Neuro/Psych negative neurological ROS  negative psych ROS   GI/Hepatic Neg liver ROS, hiatal hernia,   Endo/Other  negative endocrine ROS  Renal/GU negative Renal ROS     Musculoskeletal   Abdominal   Peds  Hematology negative hematology ROS (+)   Anesthesia Other Findings   Reproductive/Obstetrics                            Anesthesia Physical Anesthesia Plan  ASA: II  Anesthesia Plan: Regional and MAC   Post-op Pain Management:    Induction:   PONV Risk Score and Plan: 4 or greater and Ondansetron, Dexamethasone, Scopolamine patch - Pre-op and Propofol infusion  Airway Management Planned: Natural Airway and Simple Face Mask  Additional Equipment:   Intra-op Plan:   Post-operative Plan:   Informed Consent: I have reviewed the patients History and Physical, chart, labs and discussed the procedure including the risks, benefits and alternatives for the proposed anesthesia with the patient or authorized representative who has indicated his/her understanding and acceptance.   Dental advisory given  Plan Discussed with: CRNA and Anesthesiologist  Anesthesia Plan Comments:        Anesthesia Quick Evaluation

## 2018-04-03 NOTE — Transfer of Care (Signed)
Immediate Anesthesia Transfer of Care Note  Patient: Darlene Harvey  Procedure(s) Performed: OPEN REDUCTION INTERNAL FIXATION (ORIF) OF LEFT  ANKLE FRACTURE (Left Ankle)  Patient Location: PACU  Anesthesia Type:MAC and Regional  Level of Consciousness: drowsy  Airway & Oxygen Therapy: Patient Spontanous Breathing and Patient connected to nasal cannula oxygen  Post-op Assessment: Report given to RN and Post -op Vital signs reviewed and stable  Post vital signs: Reviewed and stable  Last Vitals:  Vitals Value Taken Time  BP 121/73 04/03/2018 10:50 AM  Temp    Pulse 94 04/03/2018 10:53 AM  Resp 16 04/03/2018 10:53 AM  SpO2 98 % 04/03/2018 10:53 AM  Vitals shown include unvalidated device data.  Last Pain:  Vitals:   04/03/18 0826  TempSrc:   PainSc: 3       Patients Stated Pain Goal: 3 (09/47/09 6283)  Complications: No apparent anesthesia complications

## 2018-04-03 NOTE — Brief Op Note (Signed)
04/03/2018  10:54 AM  PATIENT:  Darlene Harvey  57 y.o. female  PRE-OPERATIVE DIAGNOSIS:  LEFT ANKLE FRACTURE  POST-OPERATIVE DIAGNOSIS:  LEFT ANKLE FRACTURE  PROCEDURE:  Procedure(s): OPEN REDUCTION INTERNAL FIXATION (ORIF) OF LEFT  ANKLE FRACTURE (Left) - Lateral malleolus.  SURGEON:  Surgeon(s) and Role:    * Erle Crocker, MD - Primary  PHYSICIAN ASSISTANT: none  ASSISTANTS: none   ANESTHESIA:   MAC  EBL:  25 mL   BLOOD ADMINISTERED:none  DRAINS: none   LOCAL MEDICATIONS USED:  NONE  SPECIMEN:  No Specimen  DISPOSITION OF SPECIMEN:  N/A  COUNTS:  YES  TOURNIQUET:  * Missing tourniquet times found for documented tourniquets in log: [id number] *  DICTATION: .Dragon Dictation  PLAN OF CARE: Discharge to home after PACU  PATIENT DISPOSITION:  PACU - hemodynamically stable.   Delay start of Pharmacological VTE agent (>24hrs) due to surgical blood loss or risk of bleeding: not applicable

## 2018-04-03 NOTE — Op Note (Signed)
Consepcion Harvey female 57 y.o. 04/03/2018  PreOperative Diagnosis: Left displaced lateral malleolus fracture  PostOperative Diagnosis: Same   Procedure(s) and Anesthesia Type:    * OPEN REDUCTION INTERNAL FIXATION left lateral malleolus- Choice  Surgeon: Erle Crocker   Assistants: None  Anesthesia: Monitored Local Anesthesia with Sedation  Findings: Left displaced distal fibula fracture without syndesmotic disruption.  Implants: Synthes locking small frag set.  Indications:57 y.o. female who fell while at work down the stairs.  She had pain in her right and left ankles.  She was seen at an urgent care and diagnosed with a left distal fibula fracture with medial clear space widening.  She was indicated for operative intervention given the instability of the fracture and ankle.  The risks, benefits and alternatives were discussed with the family.  The risks included but were not limited to wound healing complications, infection, malunion or nonunion, need for second surgery, damage surrounding structures, anesthetic risk.  After weighing these risks the patient and her husband consented to proceed with surgery.  Procedure Detail: Patient was seen in the preoperative holding area and the left lower extremity was marked by myself.  The consent was signed by myself and the patient.  She was given a regional anesthetic block by anesthesia.  Using the operative room and placed supine on the operative table.  A bump was placed in the left hip and left thigh tourniquet was placed.  MAC anesthesia was induced without difficulty.  Clindamycin was given.  The left lower extremity was prepped and draped in the usual sterile fashion.  A timeout was performed.  I began the procedure making a 5 cm incision over the distal fibula centered over the fracture site.  This taken sharply down through skin and subcu tissue.  Then blunt dissection was used to identify the superficial peroneal nerve and was  protected throughout the case.  Then I incised over the fibula down to the periosteum and elevated flaps over the distal fibula identified the fracture site.  Then using a curette and rondure the fracture site was cleaned out and the fragment mobilized.  Then the fracture site was irrigated.  I was able to inspect the syndesmotic ligaments and they appeared to be intact.  Then the fracture was reduced using a pointed reduction clamp.  Then a 3.5 millimeter screw was placed perpendicular to the fracture site in a lag fashion.  Then a 6 hole one third tubular plate was placed without difficulty.  This was filled with locking and nonlocking screws.  Appropriate reduction of the fracture was confirmed on AP and lateral fluoroscopy.  The screw lengths were confirmed as well.  After placement of the plate stress view fluoroscopy was used and there was no syndesmotic widening or medial clear space widening.  The ankle was deemed stable.  The surgical site was irrigated with copious amounts of sterile saline and the deep tissue was closed with 2-0 PDS suture.  The subtenons tissue was closed with 3-0 Monocryl in the skin with 3-0 nylon.  Xeroform, 4 x 4's and a soft dressing was placed.  Then she was placed in a short leg splint.  All counts were correct at the end of the case.  There were no complications.  Post Op Instructions: Nonweightbearing left lower extremity Weightbearing as tolerated in the boot on the right. She will take 325 mg aspirin for DVT prophylaxis She will work with physical therapy. She will return to clinic in 2 weeks for splint removal,  x-rays and suture removal if appropriate.  Tourniquette Time:none  Estimated Blood Loss:  Minimal         Drains: none  Blood Given: none         Specimens: none       Complications:  * No complications entered in OR log *         Disposition: PACU - hemodynamically stable.         Condition: stable

## 2018-04-03 NOTE — H&P (Signed)
Darlene Harvey is an 57 y.o. female.   Chief Complaint: Left ankle fracture HPI: Patient fell at work down the stairs.  She sustained a right ankle sprain and a left ankle fracture.  She was seen in the office and diagnosed with a displaced distal fibula fracture with medial clear space widening.  She was indicated for operative intervention.  She denies any other joint extremity pain with exception of her right ankle and left ankle.  Past Medical History:  Diagnosis Date  . Asthma   . Complication of anesthesia    slow to awaken  . Constipation   . Family history of adverse reaction to anesthesia    father- NV difficulty to wake up  . Head injury with loss of consciousness ([place])   . Heart murmur    was evaluated by Dr Nadyne Coombes  . High cholesterol   . History of hiatal hernia   . Melanoma ([place])   . OSA (obstructive sleep apnea) 07/02/2013   CPAP  . Pneumonia   . PONV (postoperative nausea and vomiting)   . Retinal tear   . Vasomotor instability    Hypotension with exercise    Past Surgical History:  Procedure Laterality Date  . ABDOMINAL HYSTERECTOMY    . COLONOSCOPY W/ POLYPECTOMY    . Rectal vaginal reconstruction    . SHOULDER SURGERY Left    reconstration  . WRIST SURGERY Right    fracture    Family History  Problem Relation Age of Onset  . Asthma Mother   . Allergies Mother   . Cancer Mother        stomach  . Cancer - Prostate Father    Social History:  reports that she has never smoked. She has never used smokeless tobacco. She reports that she drinks about 1.0 standard drinks of alcohol per week. She reports that she does not use drugs.  Allergies:  Allergies  Allergen Reactions  . Bee Venom Anaphylaxis  . Ciprofloxacin Hives and Shortness Of Breath  . Macrobid [Nitrofurantoin] Anaphylaxis  . Metrizamide Anaphylaxis  . Penicillins Hives, Shortness Of Breath and Other (See Comments)    Has patient had a PCN reaction causing immediate rash,  facial/tongue/throat swelling, SOB or lightheadedness with hypotension: Yes Has patient had a PCN reaction causing severe rash involving mucus membranes or skin necrosis: No Has patient had a PCN reaction that required hospitalization: No Has patient had a PCN reaction occurring within the last 10 years: No If all of the above answers are "NO", then may proceed with Cephalosporin use.  . Red Dye Hives and Shortness Of Breath  . Sulfa Antibiotics Hives and Shortness Of Breath  . Iodinated Diagnostic Agents Hives  . Oxycodone Nausea And Vomiting  . Ketoconazole Rash    Medications Prior to Admission  Medication Sig Dispense Refill  . magnesium gluconate (MAGONATE) 500 MG tablet Take 500 mg by mouth daily as needed (constipation).    . Multiple Vitamin (MULTIVITAMIN WITH MINERALS) TABS tablet Take 5 tablets by mouth daily.    . NON FORMULARY hormone replacement pellets injected subcutaneous every 4 to 5 months    . oxyCODONE-acetaminophen (PERCOCET) 5-325 MG tablet Take 1 tablet by mouth every 6 (six) hours as needed for moderate pain or severe pain. 15 tablet 0  . Albuterol Sulfate (PROAIR RESPICLICK) 732 (90 Base) MCG/ACT AEPB Inhale 2 puffs into the lungs every 6 (six) hours as needed (for wheezing/shortness of breath).    . EPINEPHrine (EPIPEN 2-PAK) 0.3 mg/0.3 mL  IJ SOAJ injection Inject 0.3 mLs (0.3 mg total) into the muscle once as needed (for severe allergic reaction). CAll 911 immediately if you have to use this medicine 1 Device 0    No results found for this or any previous visit (from the past 48 hour(s)). No results found.  Review of Systems  Constitutional: Negative.   HENT: Negative.   Eyes: Negative.   Respiratory: Negative.   Cardiovascular: Negative.   Genitourinary: Negative.   Musculoskeletal:       Left ankle pain  Skin: Negative.   Neurological: Negative.   Psychiatric/Behavioral: Negative.     Blood pressure (!) 143/92, pulse 80, resp. rate 18, height 5'  (1.524 m), weight 77.1 kg, SpO2 95 %. Physical Exam  Constitutional: She is oriented to person, place, and time. She appears well-developed.  HENT:  Head: Normocephalic.  Eyes: Conjunctivae are normal.  Neck: Neck supple.  Cardiovascular: Normal rate.  Respiratory: Effort normal.  GI: Soft.  Musculoskeletal:  LLE in splint.  Wiggles toes. SILT toes. WWP.  Neurological: She is alert and oriented to person, place, and time.  Skin: Skin is warm.  Psychiatric: She has a normal mood and affect.     Assessment/Plan We will proceed with open treatment of left distal fibula and possible syndesmosis.  She will be nonweightbearing postoperatively and placed in a splint.  She understands the risks, benefits and alternatives to surgery and wishes to proceed.   She will be discharged from the PACU.   Erle Crocker, MD 04/03/2018, 8:59 AM

## 2018-04-03 NOTE — Plan of Care (Signed)

## 2018-04-03 NOTE — Progress Notes (Signed)
Dr. Lucia Gaskins gave husband the option to stay the night. Husband spoke with pt and they agreed to stay the night. MD made aware and RN awaiting admit orders.

## 2018-04-03 NOTE — Discharge Instructions (Signed)
DR. Lucia Gaskins FOOT & ANKLE SURGERY POST-OP INSTRUCTIONS   Pain Management 1. The numbing medicine and your leg will last around 18 hours, take a dose of your pain medicine as soon as you feel it wearing off to avoid rebound pain. 2. Keep your foot elevated above heart level.  Make sure that your heel hangs free ('floats'). 3. Take all prescribed medication as directed. 4. If taking narcotic pain medication you may want to use an over-the-counter stool softener to avoid constipation. 5. You may take over-the-counter acetaminophen as directed on the packaging as a supplement for your pain and may also use it to wean away from the prescription medication.  Activity  ? Non-weightbearing  First Postoperative Visit 1. Your first postop visit will be at least 2 weeks after surgery.  This should be scheduled when you schedule surgery. 2. If you do not have a postoperative visit scheduled please call [phone] to schedule an appointment. 3. At the appointment your incision will be evaluated for suture removal, x-rays will be obtained if necessary.  General Instructions 1. Swelling is very common after foot and ankle surgery.  It often takes 3 months for the foot and ankle to begin to feel comfortable.  Some amount of swelling will persist for 6-12 months. 2. DO NOT change the dressing.  If there is a problem with the dressing (too tight, loose, gets wet, etc.) please contact Dr. Pollie Friar office. 3. DO NOT get the dressing wet.  For showers you can use an over-the-counter cast cover or wrap a washcloth around the top of your dressing and then cover it with a plastic bag and tape it to your leg. 4. DO NOT soak the incision (no tubs, pools, bath, etc.) until you have approval from Dr. Lucia Gaskins.  Contact Dr. Huel Cote office or go to Emergency Room if: 1. Temperature above 101 F. 2. Increasing pain that is unresponsive to pain medication or elevation 3. Excessive redness or swelling in your foot 4. Dressing  problems - excessive bloody drainage, looseness or tightness, or if dressing gets wet 5. Develop pain, swelling, warmth, or discoloration of your calf

## 2018-04-03 NOTE — Anesthesia Postprocedure Evaluation (Signed)
Anesthesia Post Note  Patient: Darlene Harvey  Procedure(s) Performed: OPEN REDUCTION INTERNAL FIXATION (ORIF) OF LEFT  ANKLE FRACTURE (Left Ankle)     Patient location during evaluation: PACU Anesthesia Type: MAC Level of consciousness: awake and alert Pain management: pain level controlled Vital Signs Assessment: post-procedure vital signs reviewed and stable Respiratory status: spontaneous breathing and respiratory function stable Cardiovascular status: stable Postop Assessment: no apparent nausea or vomiting Anesthetic complications: no    Last Vitals:  Vitals:   04/03/18 1215 04/03/18 1229  BP: 130/84 (!) 112/92  Pulse: 93 89  Resp:    Temp:    SpO2: 98% 97%    Last Pain:  Vitals:   04/03/18 1215  TempSrc:   PainSc: 2                  Jame Morrell DANIEL

## 2018-04-04 ENCOUNTER — Encounter (HOSPITAL_COMMUNITY): Payer: Self-pay | Admitting: General Practice

## 2018-04-04 ENCOUNTER — Encounter (INDEPENDENT_AMBULATORY_CARE_PROVIDER_SITE_OTHER): Payer: BC Managed Care – PPO | Admitting: Ophthalmology

## 2018-04-04 DIAGNOSIS — S8262XA Displaced fracture of lateral malleolus of left fibula, initial encounter for closed fracture: Secondary | ICD-10-CM | POA: Diagnosis not present

## 2018-04-04 MED ORDER — ASPIRIN 325 MG PO TABS
325.0000 mg | ORAL_TABLET | Freq: Every day | ORAL | Status: DC
  Administered 2018-04-04 – 2018-04-05 (×2): 325 mg via ORAL
  Filled 2018-04-04 (×2): qty 1

## 2018-04-04 NOTE — Evaluation (Signed)
Occupational Therapy Evaluation Patient Details Name: Darlene Harvey MRN: [id number] DOB: 1961/01/27 Today's Date: 04/04/2018    History of Present Illness Pt is a 57 y/o female admitted after fall down steps. Pt with R ankle sprain and L ankle fx. Pt is s/p ORIF of L ankle fx. PMH inlcudes asthma, OSA on CPAP, and L shoulder surgery.    Clinical Impression   PTA Pt independent in ADL and mobility. Works full time, drives, was anticipating retina sx 10/30. Currently min A for transfers, mod A for bathing, LB dressing. Pt will require tub bench due to WB precautions to be able to access tub - a shower chair is not efficient or safe. Pt will also require HHOT to assist Pt with ADL in her functional environment; OT provided education on smart clothing choices, AE, and compensatory strategies. OT to defer further therapy to home environment.     Follow Up Recommendations  Home health OT    Equipment Recommendations  Tub/shower bench;Other (comment)(AE - long handle sponge, reacher grabber)    Recommendations for Other Services       Precautions / Restrictions Precautions Precautions: Fall;Other (comment) Precaution Comments: Pt reports history of retinal issues and has to limit weightbearing through UEs.  Required Braces or Orthoses: Other Brace/Splint Restrictions Weight Bearing Restrictions: Yes RLE Weight Bearing: Weight bearing as tolerated(in CAM bood) LLE Weight Bearing: Non weight bearing      Mobility Bed Mobility Overal bed mobility: Needs Assistance Bed Mobility: Supine to Sit;Sit to Supine     Supine to sit: Supervision Sit to supine: Supervision   General bed mobility comments: Supervision for safety. Increased time required.   Transfers Overall transfer level: Needs assistance Equipment used: Rolling walker (2 wheeled) Transfers: Sit to/from Stand Sit to Stand: Min assist Stand pivot transfers: Min assist       General transfer comment: Min A for boost  and steadying    Balance Overall balance assessment: Needs assistance Sitting-balance support: No upper extremity supported;Feet supported Sitting balance-Leahy Scale: Fair     Standing balance support: Bilateral upper extremity supported;During functional activity Standing balance-Leahy Scale: Poor Standing balance comment: Reliant on BUE support                            ADL either performed or assessed with clinical judgement   ADL Overall ADL's : Needs assistance/impaired Eating/Feeding: Independent;Bed level;Sitting   Grooming: Set up;Sitting   Upper Body Bathing: Set up;Sitting;Moderate assistance Upper Body Bathing Details (indicate cue type and reason): able to wash body, requires assist for hair - more due to shower arrangement/logistics Lower Body Bathing: Set up;Sitting/lateral leans   Upper Body Dressing : Set up;Sitting;Bed level   Lower Body Dressing: Min guard;Sitting/lateral leans Lower Body Dressing Details (indicate cue type and reason): Pt able to bend and reach to put items of clothing over feet - educated to dress less mobile/more painful LLE first, educated Pt to perform lateral leans to pull up undergarmets - and talked about smart clothing options.  Toilet Transfer: Minimal assistance;Stand-pivot;BSC;RW   Toileting- Clothing Manipulation and Hygiene: Set up;Sitting/lateral lean   Tub/ Shower Transfer: Tub transfer;Minimal assistance;Tub bench Tub/Shower Transfer Details (indicate cue type and reason): dependent on tub bench, a shower chair is NOT a feasable option at this point Functional mobility during ADLs: Minimal assistance       Vision Baseline Vision/History: (Precarious Retina) Patient Visual Report: Other (comment) Additional Comments: Pt has flashes and floaters  due to her retinal attachment becoming more fibrous and so she has lifting restrictions of 5lbs     Perception     Praxis      Pertinent Vitals/Pain Pain  Assessment: Faces Faces Pain Scale: Hurts a little bit Pain Location: R ankle  Pain Descriptors / Indicators: Aching;Grimacing;Guarding Pain Intervention(s): Limited activity within patient's tolerance;Monitored during session;Repositioned;Other (comment)(elevation)     Hand Dominance Right   Extremity/Trunk Assessment Upper Extremity Assessment Upper Extremity Assessment: Generalized weakness;Overall [facility] for tasks assessed;RUE deficits/detail(decreased ability to push BUE bc of precaurious retina) RUE Deficits / Details: previous TSA, wrist re-construction and so weak at baseline for transfers    Lower Extremity Assessment Lower Extremity Assessment: Defer to PT evaluation RLE Deficits / Details: RLE in CAM boot. Pt with brusing at ankle secondary to sprain.  LLE Deficits / Details: L ankle casted. LLE Sensation: decreased light touch   Cervical / Trunk Assessment Cervical / Trunk Assessment: Normal   Communication Communication Communication: No difficulties   Cognition Arousal/Alertness: Awake/alert Behavior During Therapy: WFL for tasks assessed/performed Overall Cognitive Status: Within Functional Limits for tasks assessed                                     General Comments       Exercises     Shoulder Instructions      Home Living Family/patient expects to be discharged to:: Private residence Living Arrangements: Spouse/significant other Available Help at Discharge: Family;Available PRN/intermittently Type of Home: House Home Access: Stairs to enter Entrance Stairs-Number of Steps: 1 Entrance Stairs-Rails: None Home Layout: Multi-level Alternate Level Stairs-Number of Steps: flight Alternate Level Stairs-Rails: None Bathroom Shower/Tub: Teacher, early years/pre: Standard     Home Equipment: Shower seat;Bedside commode;Wheelchair - manual;Toilet riser;Hand held shower head;Adaptive equipment Adaptive Equipment: Reacher Additional  Comments: Pt reports husband needs to go back to work on 10/31.      Prior Functioning/Environment Level of Independence: Needs assistance  Gait / Transfers Assistance Needed: Pt reports husband has been taking WC up and down steps. Has had to use cart to get to her back door as terrain is not good for WC.  ADL's / Homemaking Assistance Needed: Reports husband has been helping with transfers to perform ADL tasks.             OT Problem List: Decreased activity tolerance;Decreased range of motion;Decreased strength;Impaired balance (sitting and/or standing);Decreased knowledge of precautions;Obesity;Impaired UE functional use;Pain      OT Treatment/Interventions:      OT Goals(Current goals can be found in the care plan section) Acute Rehab OT Goals Patient Stated Goal: to be able to underwear on and off OT Goal Formulation: With patient Time For Goal Achievement: 04/18/18 Potential to Achieve Goals: Good  OT Frequency:     Barriers to D/C:            Co-evaluation              AM-PAC PT "6 Clicks" Daily Activity     Outcome Measure Help from another person eating meals?: None Help from another person taking care of personal grooming?: None(at bed level or sitting) Help from another person toileting, which includes using toliet, bedpan, or urinal?: A Little Help from another person bathing (including washing, rinsing, drying)?: A Lot Help from another person to put on and taking off regular upper body clothing?: None Help from another  person to put on and taking off regular lower body clothing?: A Lot 6 Click Score: 19   End of Session Equipment Utilized During Treatment: Gait belt;Rolling walker;Other (comment)(CAM boot) Nurse Communication: Mobility status;Precautions;Weight bearing status  Activity Tolerance: Patient tolerated treatment well Patient left: in bed;with call bell/phone within reach;with bed alarm set  OT Visit Diagnosis: Unsteadiness on feet  (R26.81);Other abnormalities of gait and mobility (R26.89);History of falling (Z91.81);Pain Pain - Right/Left: Left(Bilateral) Pain - part of body: Ankle and joints of foot                Time: 7939-0300 OT Time Calculation (min): 25 min Charges:  OT General Charges $OT Visit: 1 Visit OT Evaluation $OT Eval Moderate Complexity: 1 Mod OT Treatments $Self Care/Home Management : 8-22 mins  Hulda Humphrey OTR/L Acute Rehabilitation Services Pager: [phone] Office: [phone]  Merri Ray Weltha Cathy 04/04/2018, 5:22 PM

## 2018-04-04 NOTE — Progress Notes (Signed)
Subjective: 1 Day Post-Op Procedure(s) (LRB): OPEN REDUCTION INTERNAL FIXATION (ORIF) OF LEFT  ANKLE FRACTURE (Left) Patient reports pain as minimal.  She did not sleep very well overnight.  She has some pain in her right ankle at the site of her ankle sprain.  She has not been out of bed yet.  No other complaints.  Objective: Vital signs in last 24 hours: Temp:  [97 F (36.1 C)-98.4 F (36.9 C)] 98 F (36.7 C) (10/30 0430) Pulse Rate:  [66-107] 66 (10/30 0430) Resp:  [11-18] 16 (10/30 0430) BP: (109-138)/(73-99) 116/85 (10/30 0430) SpO2:  [94 %-100 %] 94 % (10/30 0430) Weight:  [77.1 kg] 77.1 kg (10/29 0826)  Intake/Output from previous day: 10/29 0701 - 10/30 0700 In: 910 [P.O.:360; I.V.:400; IV Piggyback:150] Out: 1175 [Urine:1150; Blood:25] Intake/Output this shift: No intake/output data recorded.   She is awake and alert Left lower externally in splint.  Toes are warm and well-perfused.  She has not regained her motor after the peripheral nerve block.  She endorses diminished sensation to light touch over the toes. Right ankle with ecchymosis and swelling.  Intact motor, sensation and vascularity.  Assessment/Plan: 1 Day Post-Op Procedure(s) (LRB): OPEN REDUCTION INTERNAL FIXATION (ORIF) OF LEFT  ANKLE FRACTURE (Left)  She is doing well now postop day 1.  She will work with physical therapy this morning and be discharged once cleared.  She is weightbearing as tolerated on the right lower extremity in a walking boot.  She is nonweightbearing on the left.  After discharge she will continue the aspirin 325 mg for DVT prophylaxis.  Post op abx completed.    Erle Crocker 04/04/2018, 7:58 AM

## 2018-04-04 NOTE — Evaluation (Signed)
Physical Therapy Evaluation Patient Details Name: Darlene Harvey MRN: [id number] DOB: May 29, 1961 Today's Date: 04/04/2018   History of Present Illness  Pt is a 57 y/o female admitted after fall down steps. Pt with R ankle sprain and L ankle fx. Pt is s/p ORIF of L ankle fx. PMH inlcudes asthma, OSA on CPAP, and L shoulder surgery.   Clinical Impression  Pt admitted secondary to problem above with deficits below. Pt requiring min to light mod A to perform stand pivot transfer to recliner using RW. Pt reports increased comfort with use of RW vs crutches she had been using at home. Feel pt would benefit from at least one more therapy session to maximize independence with transfers as her husband will have to return to work on 10/31. Feel pt would also benefit from [facility] services. Will continue to follow acutely to maximize functional mobility independence and safety.     Follow Up Recommendations Home health PT;Supervision for mobility/OOB(HHOT, HHaide )    Equipment Recommendations  Rolling walker with 5" wheels;Other (comment)(tub bench )    Recommendations for Other Services OT consult     Precautions / Restrictions Precautions Precautions: Fall;Other (comment) Precaution Comments: Pt reports history of retinal issues and has to limit weightbearing through UEs.  Required Braces or Orthoses: Other Brace/Splint Restrictions Weight Bearing Restrictions: Yes RLE Weight Bearing: Weight bearing as tolerated(in CAM bood) LLE Weight Bearing: Non weight bearing      Mobility  Bed Mobility Overal bed mobility: Needs Assistance Bed Mobility: Supine to Sit     Supine to sit: Supervision     General bed mobility comments: Supervision for safety. Increased time required.   Transfers Overall transfer level: Needs assistance Equipment used: Rolling walker (2 wheeled) Transfers: Sit to/from [facility] Sit to Stand: Min assist;Mod assist Stand pivot transfers: Min  assist       General transfer comment: Min to light mod A for lift assist and steadying. Verbal cues for safe hand placement. Min A to perform pivotal steps on RLE.   Ambulation/Gait                Stairs            Wheelchair Mobility    Modified Rankin (Stroke Patients Only)       Balance Overall balance assessment: Needs assistance Sitting-balance support: No upper extremity supported;Feet supported Sitting balance-Leahy Scale: Fair     Standing balance support: Bilateral upper extremity supported;During functional activity Standing balance-Leahy Scale: Poor Standing balance comment: Reliant on BUE support                              Pertinent Vitals/Pain Pain Assessment: Faces Faces Pain Scale: Hurts little more Pain Location: R ankle  Pain Descriptors / Indicators: Aching;Grimacing;Guarding Pain Intervention(s): Limited activity within patient's tolerance;Monitored during session;Repositioned    Home Living Family/patient expects to be discharged to:: Private residence Living Arrangements: Spouse/significant other Available Help at Discharge: Family;Available PRN/intermittently Type of Home: House Home Access: Stairs to enter Entrance Stairs-Rails: None Entrance Stairs-Number of Steps: 1 Home Layout: Multi-level Home Equipment: Shower seat;Bedside commode;Wheelchair - manual Additional Comments: Pt reports husband needs to go back to work on 10/31.    Prior Function Level of Independence: Needs assistance   Gait / Transfers Assistance Needed: Pt reports husband has been taking WC up and down steps. Has had to use cart to get to her back door as terrain  is not good for WC.   ADL's / Homemaking Assistance Needed: Reports husband has been helping with transfers to perform ADL tasks.         Hand Dominance        Extremity/Trunk Assessment   Upper Extremity Assessment Upper Extremity Assessment: Defer to OT evaluation     Lower Extremity Assessment Lower Extremity Assessment: RLE deficits/detail;LLE deficits/detail RLE Deficits / Details: RLE in CAM boot. Pt with brusing at ankle secondary to sprain.  LLE Deficits / Details: L ankle casted. Pt reports block is still present in L ankle.  LLE Sensation: decreased light touch    Cervical / Trunk Assessment Cervical / Trunk Assessment: Normal  Communication   Communication: No difficulties  Cognition Arousal/Alertness: Awake/alert Behavior During Therapy: WFL for tasks assessed/performed Overall Cognitive Status: Within Functional Limits for tasks assessed                                        General Comments General comments (skin integrity, edema, etc.): Pt reports she is having retinal issues in her eye and her MD has advised not to lift/press through more than 10 lbs.     Exercises     Assessment/Plan    PT Assessment Patient needs continued PT services  PT Problem List Decreased strength;Decreased balance;Decreased mobility;Decreased knowledge of use of DME;Decreased knowledge of precautions;Pain       PT Treatment Interventions DME instruction;Functional mobility training;Therapeutic exercise;Therapeutic activities;Balance training;Patient/family education;Wheelchair mobility training    PT Goals (Current goals can be found in the Care Plan section)  Acute Rehab PT Goals Patient Stated Goal: to be able to move around better PT Goal Formulation: With patient Time For Goal Achievement: 04/18/18 Potential to Achieve Goals: Good    Frequency Min 5X/week   Barriers to discharge Decreased caregiver support      Co-evaluation               AM-PAC PT "6 Clicks" Daily Activity  Outcome Measure Difficulty turning over in bed (including adjusting bedclothes, sheets and blankets)?: A Little Difficulty moving from lying on back to sitting on the side of the bed? : A Lot Difficulty sitting down on and standing up from  a chair with arms (e.g., wheelchair, bedside commode, etc,.)?: Unable Help needed moving to and from a bed to chair (including a wheelchair)?: A Little Help needed walking in hospital room?: Total Help needed climbing 3-5 steps with a railing? : Total 6 Click Score: 11    End of Session Equipment Utilized During Treatment: Gait belt Activity Tolerance: Patient tolerated treatment well Patient left: in chair;with call bell/phone within reach Nurse Communication: Mobility status PT Visit Diagnosis: Unsteadiness on feet (R26.81);Muscle weakness (generalized) (M62.81);Difficulty in walking, not elsewhere classified (R26.2)    Time: 0086-7619 PT Time Calculation (min) (ACUTE ONLY): 33 min   Charges:   PT Evaluation $PT Eval Moderate Complexity: 1 Mod PT Treatments $Therapeutic Activity: 8-22 mins        Leighton Ruff, PT, DPT  Acute Rehabilitation Services  Pager: [phone] Office: [phone]   Rudean Hitt 04/04/2018, 12:45 PM

## 2018-04-05 DIAGNOSIS — S8262XA Displaced fracture of lateral malleolus of left fibula, initial encounter for closed fracture: Secondary | ICD-10-CM | POA: Diagnosis not present

## 2018-04-05 NOTE — Care Management Note (Signed)
Case Management Note  Patient Details  Name: Darlene Harvey MRN: [id number] Date of Birth: [birth year]  Subjective/Objective:  57 yr old female s/p ORIF of left lateral malleolus fracture, sustained from a fall at work.                    Action/Plan: Case manager spoke with patient concerning dischrge plan and DME needs. Patient is under worker's comp. CM contacted her worker's comp nurse case manager  Bernadette Hoit [phone], faxed orders for Home Health and DME to her at [phone]. Patient's family is building ramp for ease of access. She will have support at discharge.    Expected Discharge Date:  04/04/18               Expected Discharge Plan:  [place]  In-House Referral:  NA  Discharge planning Services  CM Consult  Post Acute Care Choice:  Durable Medical Equipment, Home Health Choice offered to:  NA(Worker's Comp)  DME Arranged:  [facility]ford Rile rolling, Tub bench DME Agency:  (workers comp will arrange )  HH Arranged:  PT, OT [place] Agency:  (to be arranged by [employer])  Status of Service:  Completed, signed off  If discussed at [employer], dates discussed:    Additional Comments:  Ninfa Meeker, RN 04/05/2018, 12:15 PM

## 2018-04-05 NOTE — Progress Notes (Signed)
Physical Therapy Treatment Patient Details Name: Darlene Harvey MRN: [id number] DOB: 1961/05/03 Today's Date: 04/05/2018    History of Present Illness Pt is a 57 y/o female admitted after fall down steps. Pt with R ankle sprain and L ankle fx. Pt is s/p ORIF of L ankle fx. PMH inlcudes asthma, OSA on CPAP, and L shoulder surgery.     PT Comments    Patient seen for mobility progression. Pt is making progress toward PT goals and is able to transfer EOB to [facility] and BSC to recliner with min guard/min A and RW. Current plan remains appropriate.    Follow Up Recommendations  Home health PT;Supervision for mobility/OOB(HHOT, HHaide )     Equipment Recommendations  Rolling walker with 5" wheels;Other (comment)(tub bench )    Recommendations for Other Services OT consult     Precautions / Restrictions Precautions Precautions: Fall;Other (comment) Precaution Comments: Pt reports history of retinal issues and has to limit weightbearing through UEs.  Required Braces or Orthoses: Other Brace/Splint Restrictions Weight Bearing Restrictions: Yes RLE Weight Bearing: Weight bearing as tolerated(in CAM boot) LLE Weight Bearing: Non weight bearing    Mobility  Bed Mobility Overal bed mobility: Needs Assistance Bed Mobility: Supine to Sit     Supine to sit: Supervision     General bed mobility comments: supervision for safety; increased time and effort needed  Transfers Overall transfer level: Needs assistance Equipment used: Rolling walker (2 wheeled) Transfers: Sit to/from [facility] Sit to Stand: Min guard Stand pivot transfers: Min assist;Min guard       General transfer comment: pt able to transfer from EOB to [facility] and BSC to recliner with cues for safe hand placement and use of RW  Ambulation/Gait                 Stairs             Wheelchair Mobility    Modified Rankin (Stroke Patients Only)       Balance Overall balance assessment:  Needs assistance Sitting-balance support: No upper extremity supported;Feet supported Sitting balance-Leahy Scale: Fair     Standing balance support: Bilateral upper extremity supported;During functional activity Standing balance-Leahy Scale: Poor Standing balance comment: Reliant on BUE support                             Cognition Arousal/Alertness: Awake/alert Behavior During Therapy: WFL for tasks assessed/performed Overall Cognitive Status: Within Functional Limits for tasks assessed                                        Exercises      General Comments        Pertinent Vitals/Pain Pain Assessment: Faces Faces Pain Scale: Hurts even more Pain Location: L LE in dependent position and R ankle with weight bearing  Pain Descriptors / Indicators: Grimacing;Guarding;Sore;Throbbing Pain Intervention(s): Limited activity within patient's tolerance;Monitored during session;Premedicated before session;Repositioned    Home Living                      Prior Function            PT Goals (current goals can now be found in the care plan section) Acute Rehab PT Goals Patient Stated Goal: to be able to move around better Progress towards PT goals: Progressing toward goals  Frequency    Min 5X/week      PT Plan Current plan remains appropriate    Co-evaluation              AM-PAC PT "6 Clicks" Daily Activity  Outcome Measure  Difficulty turning over in bed (including adjusting bedclothes, sheets and blankets)?: A Little Difficulty moving from lying on back to sitting on the side of the bed? : A Lot Difficulty sitting down on and standing up from a chair with arms (e.g., wheelchair, bedside commode, etc,.)?: Unable Help needed moving to and from a bed to chair (including a wheelchair)?: A Little Help needed walking in hospital room?: Total Help needed climbing 3-5 steps with a railing? : Total 6 Click Score: 11    End of  Session Equipment Utilized During Treatment: Gait belt Activity Tolerance: Patient tolerated treatment well Patient left: in chair;with call bell/phone within reach Nurse Communication: Mobility status PT Visit Diagnosis: Unsteadiness on feet (R26.81);Muscle weakness (generalized) (M62.81);Difficulty in walking, not elsewhere classified (R26.2)     Time: 9811-9147 PT Time Calculation (min) (ACUTE ONLY): 23 min  Charges:  $Therapeutic Activity: 23-37 mins                     Earney Navy, PTA Acute Rehabilitation Services Pager: [phone] Office: [phone]     Darliss Cheney 04/05/2018, 2:04 PM

## 2018-04-05 NOTE — Discharge Summary (Signed)
Physician Discharge Summary  Patient ID: Darlene Harvey MRN: [id number] DOB/AGE: 10/22/1960 57 y.o.  Admit date: 04/03/2018 Discharge date: 04/05/2018  Admission Diagnoses: Left lateral malleolus fracture, right ankle sprain  Discharge Diagnoses: Same Active Problems:   Closed left ankle fracture   Discharged Condition: good  Hospital Course: Jeidi was admitted for observation after undergoing open treatment of her left lateral malleolus fracture.  She was made nonweightbearing on the left lower extremity and weightbearing as tolerated in the boot on the right lower extremity.  She was seen by physical therapy and appropriate DME was requested.  She had issues with pain control and mobilization and therefore required a 2 midnight stay.  On postoperative day 2 she was deemed suitable for discharge after obtaining the DME and having a ramp built at her house.  She was taking oral pain medications for pain control.  Consults: None  Significant Diagnostic Studies: none  Treatments: therapies: PT and OT  Discharge Exam: Blood pressure 107/75, pulse 68, temperature 98 F (36.7 C), temperature source Oral, resp. rate 20, height 5' (1.524 m), weight 77.1 kg, SpO2 95 %. General appearance: alert and no distress Head: Normocephalic, without obvious abnormality, atraumatic Resp: Respirations are even and unlabored Cardio: regular rate and rhythm GI: normal findings: No distension Extremities: Splint in place to left lower extremity.  Is able to wiggle toes.  Endorses silt over toes.  Warm and well-perfused.  Right leg with swelling and ecchymosis per ankle fracture.  She is able to dorsiflex plantarflex the ankle as well as invert and evert the hindfoot.  Endorses a sensation to light touch.  Foot is warm and well-perfused. Skin: Skin color, texture, turgor normal. No rashes or lesions Neurologic: Grossly normal  Disposition: Discharge disposition: 01-Home or Self Care      No  distention no distentionPatient is nonweightbearing on the left lower extremity.  She is weightbearing as tolerated right lower examinee.  I would like her to take 325 mg aspirin for 1 month for DVT prophylaxis. She will follow-up as scheduled.  She will keep the splint dry and clean.  Call the office with concerns or splint issues.   Discharge Instructions    Call MD / Call 911   Complete by:  As directed    If you experience chest pain or shortness of breath, CALL 911 and be transported to the hospital emergency room.  If you develope a fever above 101 F, pus (white drainage) or increased drainage or redness at the wound, or calf pain, call your surgeon's office.   Constipation Prevention   Complete by:  As directed    Drink plenty of fluids.  Prune juice may be helpful.  You may use a stool softener, such as Colace (over the counter) 100 mg twice a day.  Use MiraLax (over the counter) for constipation as needed.   Diet - low sodium heart healthy   Complete by:  As directed      Allergies as of 04/05/2018      Reactions   Bee Venom Anaphylaxis   Ciprofloxacin Hives, Shortness Of Breath   Macrobid [nitrofurantoin] Anaphylaxis   Metrizamide Anaphylaxis   Penicillins Hives, Shortness Of Breath, Other (See Comments)   Has patient had a PCN reaction causing immediate rash, facial/tongue/throat swelling, SOB or lightheadedness with hypotension: Yes Has patient had a PCN reaction causing severe rash involving mucus membranes or skin necrosis: No Has patient had a PCN reaction that required hospitalization: No Has patient had a PCN  reaction occurring within the last 10 years: No If all of the above answers are "NO", then may proceed with Cephalosporin use.   Red Dye Hives, Shortness Of Breath   Sulfa Antibiotics Hives, Shortness Of Breath   Iodinated Diagnostic Agents Hives   Oxycodone Nausea And Vomiting   Ketoconazole Rash      Medication List    STOP taking these medications    oxyCODONE-acetaminophen 5-325 MG tablet Commonly known as:  PERCOCET/ROXICET     TAKE these medications   aspirin 325 MG tablet Take 1 tablet (325 mg total) by mouth daily.   EPINEPHrine 0.3 mg/0.3 mL Soaj injection Commonly known as:  EPI-PEN Inject 0.3 mLs (0.3 mg total) into the muscle once as needed (for severe allergic reaction). CAll 911 immediately if you have to use this medicine   magnesium gluconate 500 MG tablet Commonly known as:  MAGONATE Take 500 mg by mouth daily as needed (constipation).   meloxicam 15 MG tablet Commonly known as:  MOBIC Take 1 tablet (15 mg total) by mouth daily.   multivitamin with minerals Tabs tablet Take 5 tablets by mouth daily.   NON FORMULARY hormone replacement pellets injected subcutaneous every 4 to 5 months   oxyCODONE 5 MG immediate release tablet Commonly known as:  Oxy IR/ROXICODONE Take 1 tablet (5 mg total) by mouth every 6 (six) hours as needed for up to 7 days.   PROAIR RESPICLICK 812 (90 Base) MCG/ACT Aepb Generic drug:  Albuterol Sulfate Inhale 2 puffs into the lungs every 6 (six) hours as needed (for wheezing/shortness of breath).            Durable Medical Equipment  (From admission, onward)         Start     Ordered   04/04/18 1359  For home use only DME Walker rolling  Once    Question:  Patient needs a walker to treat with the following condition  Answer:  Ankle fracture, left   04/04/18 1400   04/04/18 1358  For home use only DME Tub bench  Once     04/04/18 1400         Follow-up Information    Erle Crocker, MD.   Specialty:  Orthopedic Surgery Why:  Follow up as scheduled Contact information: [place] [address] [phone]           Signed: Erle Crocker 04/05/2018, 8:51 AM

## 2018-04-05 NOTE — Care Management (Signed)
Case manager faxed orders for tub bench, long handled sponge and reacher to patient's worker's comp nurse Case Manager- Beth [phone]. Patient has all other DME at home. Family is preparing a ramp for ease of access.

## 2018-04-05 NOTE — Progress Notes (Signed)
Pt given prescriptions and discharge instructions. Instructions gone over with her and she verbalized understanding. Equipment delivered to house. Husband driving her home.

## 2019-03-27 ENCOUNTER — Other Ambulatory Visit: Payer: Self-pay

## 2019-03-27 DIAGNOSIS — Z20822 Contact with and (suspected) exposure to covid-19: Secondary | ICD-10-CM

## 2019-03-29 LAB — NOVEL CORONAVIRUS, NAA: SARS-CoV-2, NAA: NOT DETECTED

## 2020-04-07 ENCOUNTER — Other Ambulatory Visit: Payer: Self-pay

## 2020-04-07 ENCOUNTER — Ambulatory Visit: Payer: BC Managed Care – PPO | Admitting: Pulmonary Disease

## 2020-04-07 ENCOUNTER — Encounter: Payer: Self-pay | Admitting: Pulmonary Disease

## 2020-04-07 ENCOUNTER — Ambulatory Visit (INDEPENDENT_AMBULATORY_CARE_PROVIDER_SITE_OTHER): Payer: BC Managed Care – PPO

## 2020-04-07 VITALS — BP 138/72 | HR 77 | Temp 97.7°F | Ht 60.0 in | Wt 175.0 lb

## 2020-04-07 DIAGNOSIS — R053 Chronic cough: Secondary | ICD-10-CM

## 2020-04-07 DIAGNOSIS — J4540 Moderate persistent asthma, uncomplicated: Secondary | ICD-10-CM

## 2020-04-07 DIAGNOSIS — G4733 Obstructive sleep apnea (adult) (pediatric): Secondary | ICD-10-CM

## 2020-04-07 MED ORDER — BUDESONIDE 180 MCG/ACT IN AEPB: 2.0000 | INHALATION_SPRAY | Freq: Two times a day (BID) | RESPIRATORY_TRACT | Status: DC

## 2020-04-07 NOTE — Progress Notes (Signed)
[facility]  Chief Complaint  Patient presents with  . Consult    Sleep Consult - Saw Ziya Coonrod in 2018 - P was preciously using CPAP several years then got a respiratory virus and had trouble wearing it - Caused a panic feeling - PT hasnt worn CPAP since - Also c/o frequent asthma attacks - No triggers that pt know of  - Occas asthma attacks during the night  - Using Pulmicort as needed and Albuterol 5-6 times a day at times    Constitutional:  BP 138/72   Pulse 77   Temp 97.7 F (36.5 C) (Oral)   Ht 5' (1.524 m)   Wt 175 lb (79.4 kg)   SpO2 96%   BMI 34.18 kg/m   Past Medical History:  Asthma, HLD, Melanoma, Vasomotor instability, Hiatal hernia, Pneumonia  Past Surgical History:  Her  has a past surgical history that includes Abdominal hysterectomy; Rectal vaginal reconstruction; Colonoscopy w/ polypectomy; Shoulder surgery (Left); Wrist surgery (Right); ORIF ankle fracture (Left, 04/03/2018); and ORIF ankle fracture (Left, 04/03/2018).  Brief Summary:  Darlene Harvey is a 59 y.o. female with obstructive sleep apnea and asthma.      Subjective:   I last saw her in February 2018.  She is originally from [place], but has lived in [place] for about 30 years.  She works as a Public relations account executive.  She has a International aid/development worker.  She has dealt with asthma for years.  She was previously on allergy shots and has multiple allergies.  Her symptoms got much worse after she had a respiratory infection in 2019.  She tested negative for COVID 19.  Her symptoms come on randomly.  She will get episodes of cough, wheezing, and shortness of breath.  She had to stop using CPAP because of cough spells at night.  She uses proair several times per week and this helps.  She uses pulmicort only when proair doesn't resolve her symptoms.  She has noticed trouble with eczema more recently.  She found mold in her house.  She thinks she was exposed to this for a while, and  had her house extensively cleaned.  She never smoked cigarettes.  Her mother had asthma.  Physical Exam:   Appearance - well kempt   ENMT - no sinus tenderness, no oral exudate, no LAN, Mallampati 3 airway, no stridor  Respiratory - equal breath sounds bilaterally, no wheezing or rales  CV - s1s2 regular rate and rhythm, no murmurs  Ext - no clubbing, no edema  Skin - no rashes  Psych - normal mood and affect   Pulmonary testing:    Chest Imaging:    Sleep Tests:   PSG 08/02/13 >> AHI 13.8, SpO2 low 90%  Auto CPAP 04/19/16 to 07/17/16 >> used on 38 of 90 nights with average 5 hrs 10 min.  Average AHI 0.9 with median CPAP 6 and 95 th percentile CPAP 7 cm H2O  Social History:  She  reports that she has never smoked. She has never used smokeless tobacco. She reports current alcohol use of about 1.0 standard drink of alcohol per week. She reports that she does not use drugs.  Family History:  Her family history includes Allergies in her mother; Asthma in her mother; Cancer in her mother; Cancer - Prostate in her father.    Discussion:  She has history of allergies and asthma.  Her symptoms have progressed since she had a respiratory infection in  2019.  She also reports exposure to black mold in her home.  Assessment/Plan:   Chronic cough from allergic asthma. - discussed different roles of her inhalers - will have her use pulmicort two puffs bid - she is to use proair two puffs q6h prn - will arrange for chest xray, CBC with diff, IgE, CMET, and pulmonary function test  Obstructive sleep apnea. - she continues to have issues with her sleep - will get her asthma symptoms under better control and then further address sleep apnea  Time Spent Involved in Patient Care on Day of Examination:  53 minutes  Follow up:  Patient Instructions  Pulmicort two puffs in the morning and two puffs in the evening, and rinse your mouth after each use  Proair two puffs every 6 hours  as needed for cough, wheeze, chest congestion, or shortness of breath  Chest xray and lab tests today  Will arrange for pulmonary function test  Follow up in 4 weeks with Dr. Halford Chessman or Nurse Practitioner   Medication List:   Allergies as of 04/07/2020      Reactions   Bee Venom Anaphylaxis   Ciprofloxacin Hives, Shortness Of Breath   Macrobid [nitrofurantoin] Anaphylaxis   Metrizamide Anaphylaxis   Penicillins Hives, Shortness Of Breath, Other (See Comments)   Has patient had a PCN reaction causing immediate rash, facial/tongue/throat swelling, SOB or lightheadedness with hypotension: Yes Has patient had a PCN reaction causing severe rash involving mucus membranes or skin necrosis: No Has patient had a PCN reaction that required hospitalization: No Has patient had a PCN reaction occurring within the last 10 years: No If all of the above answers are "NO", then may proceed with Cephalosporin use.   Red Dye Hives, Shortness Of Breath   Sulfa Antibiotics Hives, Shortness Of Breath   Iodinated Diagnostic Agents Hives   Oxycodone Nausea And Vomiting   Ketoconazole Rash      Medication List       Accurate as of April 07, 2020  9:46 AM. If you have any questions, ask your nurse or doctor.        STOP taking these medications   multivitamin with minerals Tabs tablet Stopped by: Chesley Mires, MD     TAKE these medications   budesonide 180 MCG/ACT inhaler Commonly known as: PULMICORT Inhale 2 puffs into the lungs in the morning and at bedtime. What changed:   how much to take  when to take this Changed by: Chesley Mires, MD   EPINEPHrine 0.3 mg/0.3 mL Soaj injection Commonly known as: EpiPen 2-Pak Inject 0.3 mLs (0.3 mg total) into the muscle once as needed (for severe allergic reaction). CAll 911 immediately if you have to use this medicine   magnesium gluconate 500 MG tablet Commonly known as: MAGONATE Take 500 mg by mouth daily as needed (constipation).   NON  FORMULARY hormone replacement pellets injected subcutaneous every 4 to 5 months   ProAir RespiClick 993 (90 Base) MCG/ACT Aepb Generic drug: Albuterol Sulfate Inhale 2 puffs into the lungs every 6 (six) hours as needed (for wheezing/shortness of breath).       Signature:  Chesley Mires, MD [place] Pager - [phone] 04/07/2020, 9:46 AM

## 2020-04-07 NOTE — Patient Instructions (Signed)
Pulmicort two puffs in the morning and two puffs in the evening, and rinse your mouth after each use  Proair two puffs every 6 hours as needed for cough, wheeze, chest congestion, or shortness of breath  Chest xray and lab tests today  Will arrange for pulmonary function test  Follow up in 4 weeks with Dr. Halford Chessman or Nurse Practitioner

## 2020-04-07 NOTE — Addendum Note (Signed)
Addended by: Suzzanne Cloud E on: 04/07/2020 04:27 PM   Modules accepted: Orders

## 2020-04-08 LAB — CBC WITH DIFFERENTIAL/PLATELET
Basophils Absolute: 0.1 10*3/uL (ref 0.0–0.1)
Basophils Relative: 0.7 % (ref 0.0–3.0)
Eosinophils Absolute: 0.3 10*3/uL (ref 0.0–0.7)
Eosinophils Relative: 3.1 % (ref 0.0–5.0)
HCT: 45.3 % (ref 36.0–46.0)
Hemoglobin: 15.2 g/dL — ABNORMAL HIGH (ref 12.0–15.0)
Lymphocytes Relative: 35.7 % (ref 12.0–46.0)
Lymphs Abs: 3.1 10*3/uL (ref 0.7–4.0)
MCHC: 33.6 g/dL (ref 30.0–36.0)
MCV: 88.1 fl (ref 78.0–100.0)
Monocytes Absolute: 0.6 10*3/uL (ref 0.1–1.0)
Monocytes Relative: 6.6 % (ref 3.0–12.0)
Neutro Abs: 4.6 10*3/uL (ref 1.4–7.7)
Neutrophils Relative %: 53.9 % (ref 43.0–77.0)
Platelets: 304 10*3/uL (ref 150.0–400.0)
RBC: 5.14 Mil/uL — ABNORMAL HIGH (ref 3.87–5.11)
RDW: 13.1 % (ref 11.5–15.5)
WBC: 8.6 10*3/uL (ref 4.0–10.5)

## 2020-04-08 LAB — COMPREHENSIVE METABOLIC PANEL
ALT: 22 U/L (ref 0–35)
AST: 19 U/L (ref 0–37)
Albumin: 4.7 g/dL (ref 3.5–5.2)
Alkaline Phosphatase: 107 U/L (ref 39–117)
BUN: 13 mg/dL (ref 6–23)
CO2: 29 mEq/L (ref 19–32)
Calcium: 9.3 mg/dL (ref 8.4–10.5)
Chloride: 102 mEq/L (ref 96–112)
Creatinine, Ser: 0.88 mg/dL (ref 0.40–1.20)
GFR: 73.11 mL/min (ref >60.00)
Glucose, Bld: 75 mg/dL (ref 70–99)
Potassium: 4 mEq/L (ref 3.5–5.1)
Sodium: 139 mEq/L (ref 135–145)
Total Bilirubin: 0.4 mg/dL (ref 0.2–1.2)
Total Protein: 6.9 g/dL (ref 6.0–8.3)

## 2020-04-08 LAB — IGE: IgE (Immunoglobulin E), Serum: 80 kU/L (ref <=114)

## 2020-04-10 ENCOUNTER — Telehealth: Payer: Self-pay | Admitting: Pulmonary Disease

## 2020-04-10 NOTE — Telephone Encounter (Signed)
DG Chest 2 View  Result Date: 04/08/2020 CLINICAL DATA:  Chronic cough. EXAM: CHEST - 2 VIEW COMPARISON:  May 28, 2013. FINDINGS: The heart size and mediastinal contours are within normal limits. Both lungs are clear. The visualized skeletal structures are unremarkable. IMPRESSION: No active cardiopulmonary disease. Electronically Signed   By: Marijo Conception M.D.   On: 04/08/2020 10:19    CBC    Component Value Date/Time   WBC 8.6 04/07/2020 1627   RBC 5.14 (H) 04/07/2020 1627   HGB 15.2 (H) 04/07/2020 1627   HCT 45.3 04/07/2020 1627   PLT 304.0 04/07/2020 1627   MCV 88.1 04/07/2020 1627   MCHC 33.6 04/07/2020 1627   RDW 13.1 04/07/2020 1627   LYMPHSABS 3.1 04/07/2020 1627   MONOABS 0.6 04/07/2020 1627   EOSABS 0.3 04/07/2020 1627   BASOSABS 0.1 04/07/2020 1627    CMP Latest Ref Rng & Units 04/07/2020  Glucose 70 - 99 mg/dL 75  BUN 6 - 23 mg/dL 13  Creatinine 0.40 - 1.20 mg/dL 0.88  Sodium 135 - 145 mEq/L 139  Potassium 3.5 - 5.1 mEq/L 4.0  Chloride 96 - 112 mEq/L 102  CO2 19 - 32 mEq/L 29  Calcium 8.4 - 10.5 mg/dL 9.3  Total Protein 6.0 - 8.3 g/dL 6.9  Total Bilirubin 0.2 - 1.2 mg/dL 0.4  Alkaline Phos 39 - 117 U/L 107  AST 0 - 37 U/L 19  ALT 0 - 35 U/L 22    IgE 04/07/20 >> 80   Please let her know that her chest xray is normal.  Her labs show a slight increase in her hemoglobin.  This can happen from have low oxygen levels that could be associated with her sleep apnea.  Will discuss in more detail at her next ROV.

## 2020-04-10 NOTE — Telephone Encounter (Signed)
Patient returned phone call and writer went over xray and lab results per Dr Halford Chessman. All questions answered and patient expressed understanding. Confirmed with patient upcoming scheduled office for 05/08/2020. Nothing further needed at this time.

## 2020-04-10 NOTE — Telephone Encounter (Signed)
Called and left message on voicemail to please return phone call for results. Contact number provided.

## 2020-05-08 ENCOUNTER — Ambulatory Visit: Payer: BC Managed Care – PPO | Admitting: Adult Health

## 2020-05-08 ENCOUNTER — Encounter: Payer: Self-pay | Admitting: Adult Health

## 2020-05-08 ENCOUNTER — Other Ambulatory Visit: Payer: Self-pay

## 2020-05-08 ENCOUNTER — Ambulatory Visit (INDEPENDENT_AMBULATORY_CARE_PROVIDER_SITE_OTHER): Payer: BC Managed Care – PPO | Admitting: Pulmonary Disease

## 2020-05-08 DIAGNOSIS — J4540 Moderate persistent asthma, uncomplicated: Secondary | ICD-10-CM

## 2020-05-08 DIAGNOSIS — G4733 Obstructive sleep apnea (adult) (pediatric): Secondary | ICD-10-CM | POA: Diagnosis not present

## 2020-05-08 DIAGNOSIS — R058 Other specified cough: Secondary | ICD-10-CM | POA: Diagnosis not present

## 2020-05-08 DIAGNOSIS — J45909 Unspecified asthma, uncomplicated: Secondary | ICD-10-CM | POA: Insufficient documentation

## 2020-05-08 DIAGNOSIS — R053 Chronic cough: Secondary | ICD-10-CM | POA: Diagnosis not present

## 2020-05-08 LAB — PULMONARY FUNCTION TEST
DL/VA % pred: 94 %
DL/VA: 4.12 ml/min/mmHg/L
DLCO cor % pred: 99 %
DLCO cor: 17.72 ml/min/mmHg
DLCO unc % pred: 104 %
DLCO unc: 18.63 ml/min/mmHg
FEF 25-75 Post: 2.53 L/sec
FEF 25-75 Pre: 2.19 L/sec
FEF2575-%Change-Post: 15 %
FEF2575-%Pred-Post: 112 %
FEF2575-%Pred-Pre: 97 %
FEV1-%Change-Post: 3 %
FEV1-%Pred-Post: 100 %
FEV1-%Pred-Pre: 97 %
FEV1-Post: 2.28 L
FEV1-Pre: 2.21 L
FEV1FVC-%Change-Post: 4 %
FEV1FVC-%Pred-Pre: 101 %
FEV6-%Change-Post: -2 %
FEV6-%Pred-Post: 95 %
FEV6-%Pred-Pre: 97 %
FEV6-Post: 2.69 L
FEV6-Pre: 2.75 L
FEV6FVC-%Change-Post: 0 %
FEV6FVC-%Pred-Post: 101 %
FEV6FVC-%Pred-Pre: 102 %
FVC-%Change-Post: -1 %
FVC-%Pred-Post: 93 %
FVC-%Pred-Pre: 94 %
FVC-Post: 2.73 L
FVC-Pre: 2.76 L
Post FEV1/FVC ratio: 84 %
Post FEV6/FVC ratio: 99 %
Pre FEV1/FVC ratio: 80 %
Pre FEV6/FVC Ratio: 100 %
RV % pred: 120 %
RV: 2.07 L
TLC % pred: 121 %
TLC: 5.41 L

## 2020-05-08 MED ORDER — BENZONATATE 200 MG PO CAPS: 200.0000 mg | ORAL_CAPSULE | Freq: Three times a day (TID) | ORAL | 1 refills | Status: DC | PRN

## 2020-05-08 NOTE — Assessment & Plan Note (Signed)
Has not been tolerating CPAP due to cough.  We will address this on return hopefully we get her cough under better control.

## 2020-05-08 NOTE — Patient Instructions (Addendum)
Begin Zyrtec 10mg  At bedtime   Begin Prilosec 20mg  daily before meal .  Begin Pepcid 20mg  daily  GERD diet  NO mints.  Sips of water to soothe throat and avoid throat clearing and coughing  Begin Deslym 2 tsp Twice daily  As needed  Cough  Begin Tessalon Three times a day  As needed cough .  Continue on Pulmicort 1 puff Twice daily  , brush/rinse/gargle .  Follow up in 4-6 weeks with Dr. Halford Chessman  Or Loveta Dellis NP and As needed   We will discuss sleep apnea and CPAP .  Please contact office for sooner follow up if symptoms do not improve or worsen or seek emergency care

## 2020-05-08 NOTE — Assessment & Plan Note (Signed)
Begin reflux and chronic rhinitis treatment regimen along with cough control regimen..  Plan  Patient Instructions  Begin Zyrtec 10mg  At bedtime   Begin Prilosec 20mg  daily before meal .  Begin Pepcid 20mg  daily  GERD diet  NO mints.  Sips of water to soothe throat and avoid throat clearing and coughing  Begin Deslym 2 tsp Twice daily  As needed  Cough  Begin Tessalon Three times a day  As needed cough .  Continue on Pulmicort 1 puff Twice daily  , brush/rinse/gargle .  Follow up in 4-6 weeks with Dr. Halford Chessman  Or Bellami Farrelly NP and As needed   We will discuss sleep apnea and CPAP .  Please contact office for sooner follow up if symptoms do not improve or worsen or seek emergency care

## 2020-05-08 NOTE — Progress Notes (Signed)
Completed full PFTs today.

## 2020-05-08 NOTE — Progress Notes (Signed)
@Patient  ID: Darlene Harvey, female    DOB: Jun 03, 1961, 59 y.o.   MRN: [id number]  Chief Complaint  Patient presents with  . Follow-up    Asthma     Referring provider: Kathyrn Lass, MD  HPI: 59 year old female followed for obstructive sleep apnea.  Reestablish November 2021 for asthma and OSA. 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  Patient was seen last visit to establish for asthma.  Patient says she has been diagnosed with asthma for many years.  Previously was on allergy shots and has multiple allergies.  Complains that she has had an ongoing cough that is been very aggravating since 2019. She gets symptoms of increased cough especially after prolonged talking and especially around any time of mealtime.  Also seems to be worse at night.  And has some postnasal drainage and frequent throat clearing. Patient was set up for pulmonary function testing that was done today that showed normal lung function with an FEV1 at 100%, ratio 84, FVC 93%, no significant bronchodilator response and DLCO at 104%. Lab work showed absolute eosinophil count at 300.  IgE was 80.  Her hemoglobin was slightly elevated at 15.2  Last visit patient was started on budesonide.  Patient says she is using this 1 puff twice daily.  Does feel that it has helped some.  However she continues to have a aggravating ongoing dry cough that is persistent.    Allergies  Allergen Reactions  . Bee Venom Anaphylaxis  . Ciprofloxacin Hives and Shortness Of Breath  . Macrobid [Nitrofurantoin] Anaphylaxis  . Metrizamide Anaphylaxis  . Penicillins Hives, Shortness Of Breath and Other (See Comments)    Has patient had a PCN reaction causing immediate rash, facial/tongue/throat swelling, SOB or lightheadedness with hypotension: Yes Has patient had a PCN  reaction causing severe rash involving mucus membranes or skin necrosis: No Has patient had a PCN reaction that required hospitalization: No Has patient had a PCN reaction occurring within the last 10 years: No If all of the above answers are "NO", then may proceed with Cephalosporin use.  . Red Dye Hives and Shortness Of Breath  . Sulfa Antibiotics Hives and Shortness Of Breath  . Iodinated Diagnostic Agents Hives  . Oxycodone Nausea And Vomiting  . Ketoconazole Rash    Immunization History  Administered Date(s) Administered  . PFIZER SARS-COV-2 Vaccination 09/05/2019, 09/27/2019    Past Medical History:  Diagnosis Date  . Asthma   . Complication of anesthesia    slow to awaken  . Constipation   . Family history of adverse reaction to anesthesia    father- NV difficulty to wake up  . Head injury with loss of consciousness ([place])   . Heart murmur    was evaluated by Dr Nadyne Coombes  . High cholesterol   . History of hiatal hernia   . Melanoma ([place])   . OSA (obstructive sleep apnea) 07/02/2013   CPAP  . Pneumonia   . PONV (postoperative nausea and vomiting)   . Retinal tear   . Vasomotor instability    Hypotension with exercise    Tobacco History: Social History   Tobacco Use  Smoking Status Never Smoker  Smokeless Tobacco Never Used   Counseling given: Not Answered   Outpatient Medications Prior to Visit  Medication Sig Dispense Refill  . Albuterol Sulfate (  PROAIR RESPICLICK) 809 (90 Base) MCG/ACT AEPB Inhale 2 puffs into the lungs every 6 (six) hours as needed (for wheezing/shortness of breath).    . budesonide (PULMICORT) 180 MCG/ACT inhaler Inhale 2 puffs into the lungs in the morning and at bedtime.    Marland Kitchen EPINEPHrine (EPIPEN 2-PAK) 0.3 mg/0.3 mL IJ SOAJ injection Inject 0.3 mLs (0.3 mg total) into the muscle once as needed (for severe allergic reaction). CAll 911 immediately if you have to use this medicine 1 Device 0  . magnesium gluconate (MAGONATE) 500 MG tablet  Take 500 mg by mouth daily as needed (constipation).    . NON FORMULARY hormone replacement pellets injected subcutaneous every 4 to 5 months     No facility-administered medications prior to visit.     Review of Systems:   Constitutional:   No  weight loss, night sweats,  Fevers, chills, fatigue, or  lassitude.  HEENT:   No headaches,  Difficulty swallowing,  Tooth/dental problems, or  Sore throat,                No sneezing, itching, ear ache, + nasal congestion, post nasal drip,   CV:  No chest pain,  Orthopnea, PND, swelling in lower extremities, anasarca, dizziness, palpitations, syncope.   GI +  heartburn, indigestion, no  abdominal pain, nausea, vomiting, diarrhea, change in bowel habits, loss of appetite, bloody stools.   Resp:  .  No chest wall deformity  Skin: no rash or lesions.  GU: no dysuria, change in color of urine, no urgency or frequency.  No flank pain, no hematuria   MS:  No joint pain or swelling.  No decreased range of motion.  No back pain.    Physical Exam  BP 120/80 (BP Location: Left Arm, Patient Position: Sitting, Cuff Size: Normal)   Pulse (!) 109   Temp (!) 97.1 F (36.2 C) (Temporal)   Ht 5' (1.524 m)   Wt 172 lb 9.6 oz (78.3 kg)   SpO2 99%   BMI 33.71 kg/m   GEN: A/Ox3; pleasant , NAD, well nourished    HEENT:  [place]/AT,   , NOSE-clear, THROAT-clear, no lesions, no postnasal drip or exudate noted.   NECK:  Supple w/ fair ROM; no JVD; normal carotid impulses w/o bruits; no thyromegaly or nodules palpated; no lymphadenopathy.    RESP  Clear  P & A; w/o, wheezes/ rales/ or rhonchi. no accessory muscle use, no dullness to percussion  CARD:  RRR, no m/r/g, no peripheral edema, pulses intact, no cyanosis or clubbing.  GI:   Soft & nt; nml bowel sounds; no organomegaly or masses detected.   Musco: Warm bil, no deformities or joint swelling noted.   Neuro: alert, no focal deficits noted.    Skin: Warm, no lesions or rashes    Lab  Results:  CBC    Component Value Date/Time   WBC 8.6 04/07/2020 1627   RBC 5.14 (H) 04/07/2020 1627   HGB 15.2 (H) 04/07/2020 1627   HCT 45.3 04/07/2020 1627   PLT 304.0 04/07/2020 1627   MCV 88.1 04/07/2020 1627   MCHC 33.6 04/07/2020 1627   RDW 13.1 04/07/2020 1627   LYMPHSABS 3.1 04/07/2020 1627   MONOABS 0.6 04/07/2020 1627   EOSABS 0.3 04/07/2020 1627   BASOSABS 0.1 04/07/2020 1627    BMET    Component Value Date/Time   NA 139 04/07/2020 1627   K 4.0 04/07/2020 1627   CL 102 04/07/2020 1627   CO2 29 04/07/2020  1627   GLUCOSE 75 04/07/2020 1627   BUN 13 04/07/2020 1627   CREATININE 0.88 04/07/2020 1627   CALCIUM 9.3 04/07/2020 1627    BNP No results found for: BNP  ProBNP No results found for: PROBNP  Imaging: No results found.    PFT Results Latest Ref Rng & Units 05/08/2020  FVC-Pre L 2.76  FVC-Predicted Pre % 94  FVC-Post L 2.73  FVC-Predicted Post % 93  Pre FEV1/FVC % % 80  Post FEV1/FCV % % 84  FEV1-Pre L 2.21  FEV1-Predicted Pre % 97  FEV1-Post L 2.28  DLCO uncorrected ml/min/mmHg 18.63  DLCO UNC% % 104  DLCO corrected ml/min/mmHg 17.72  DLCO COR %Predicted % 99  DLVA Predicted % 94  TLC L 5.41  TLC % Predicted % 121  RV % Predicted % 120    No results found for: NITRICOXIDE      Assessment & Plan:   Asthma Allergic asthma with associated upper airway cough.  May have triggers of postnasal drainage and GERD.  Will place on trigger prevention regimen along with cough control.  Would continue budesonide now and advised of strict oral inhaler care.  Plan  Patient Instructions  Begin Zyrtec 10mg  At bedtime   Begin Prilosec 20mg  daily before meal .  Begin Pepcid 20mg  daily  GERD diet  NO mints.  Sips of water to soothe throat and avoid throat clearing and coughing  Begin Deslym 2 tsp Twice daily  As needed  Cough  Begin Tessalon Three times a day  As needed cough .  Continue on Pulmicort 1 puff Twice daily  , brush/rinse/gargle .   Follow up in 4-6 weeks with Dr. Halford Chessman  Or Caid Radin NP and As needed   We will discuss sleep apnea and CPAP .  Please contact office for sooner follow up if symptoms do not improve or worsen or seek emergency care       OSA (obstructive sleep apnea) Has not been tolerating CPAP due to cough.  We will address this on return hopefully we get her cough under better control.  Upper airway cough syndrome Begin reflux and chronic rhinitis treatment regimen along with cough control regimen..  Plan  Patient Instructions  Begin Zyrtec 10mg  At bedtime   Begin Prilosec 20mg  daily before meal .  Begin Pepcid 20mg  daily  GERD diet  NO mints.  Sips of water to soothe throat and avoid throat clearing and coughing  Begin Deslym 2 tsp Twice daily  As needed  Cough  Begin Tessalon Three times a day  As needed cough .  Continue on Pulmicort 1 puff Twice daily  , brush/rinse/gargle .  Follow up in 4-6 weeks with Dr. Halford Chessman  Or Lucianne Smestad NP and As needed   We will discuss sleep apnea and CPAP .  Please contact office for sooner follow up if symptoms do not improve or worsen or seek emergency care          Rexene Edison, NP 05/08/2020

## 2020-05-08 NOTE — Assessment & Plan Note (Signed)
Allergic asthma with associated upper airway cough.  May have triggers of postnasal drainage and GERD.  Will place on trigger prevention regimen along with cough control.  Would continue budesonide now and advised of strict oral inhaler care.  Plan  Patient Instructions  Begin Zyrtec 10mg  At bedtime   Begin Prilosec 20mg  daily before meal .  Begin Pepcid 20mg  daily  GERD diet  NO mints.  Sips of water to soothe throat and avoid throat clearing and coughing  Begin Deslym 2 tsp Twice daily  As needed  Cough  Begin Tessalon Three times a day  As needed cough .  Continue on Pulmicort 1 puff Twice daily  , brush/rinse/gargle .  Follow up in 4-6 weeks with Dr. Halford Chessman  Or Birdie Beveridge NP and As needed   We will discuss sleep apnea and CPAP .  Please contact office for sooner follow up if symptoms do not improve or worsen or seek emergency care

## 2020-06-04 ENCOUNTER — Telehealth: Payer: Self-pay | Admitting: *Deleted

## 2020-06-04 NOTE — Telephone Encounter (Signed)
Called and spoke with Marchelle Folks with [employer] as there was no data in in [place] in over a year.  She stated that she was noncompliant in 2018 and has not been ordering any supplies recently.  Currently, there is not remote monitoring.  Called and spoke with patient, she verified she has not been using her CPAP machine since she had a respiratory infection and then she just could not tolerate the CPAP machine.  She states she is going to be revisiting using the CPAP machine.  She has a f/u on Monday.  Noting further needed.

## 2020-06-08 ENCOUNTER — Encounter: Payer: Self-pay | Admitting: Adult Health

## 2020-06-08 ENCOUNTER — Other Ambulatory Visit: Payer: Self-pay

## 2020-06-08 ENCOUNTER — Ambulatory Visit: Payer: BC Managed Care – PPO | Admitting: Adult Health

## 2020-06-08 VITALS — BP 130/86 | HR 85 | Temp 98.1°F | Ht 60.0 in | Wt 176.6 lb

## 2020-06-08 DIAGNOSIS — J4530 Mild persistent asthma, uncomplicated: Secondary | ICD-10-CM | POA: Diagnosis not present

## 2020-06-08 DIAGNOSIS — G4733 Obstructive sleep apnea (adult) (pediatric): Secondary | ICD-10-CM

## 2020-06-08 NOTE — Assessment & Plan Note (Signed)
Moderate OSA - restart CPAP  Trial of dreamwear nasal mask   Plan  Patient Instructions  Zyrtec 10mg  At bedtime   Continue on Prilosec 20mg  daily before meal .  Continue on Pepcid 20mg  daily  GERD diet  NO mints.  Sips of water to soothe throat and avoid throat clearing and coughing  Deslym 2 tsp Twice daily  As needed  Cough  Tessalon Three times a day  As needed cough .  Ventolin inhaler As needed   Restart CPAP At bedtime   Order for New Dreamwear nasal mask.  Follow up in 2-3  months with Dr.  Or Raj Landress NP and As needed   Please contact office for sooner follow up if symptoms do not improve or worsen or seek emergency care

## 2020-06-08 NOTE — Patient Instructions (Addendum)
Zyrtec 10mg  At bedtime   Continue on Prilosec 20mg  daily before meal .  Continue on Pepcid 20mg  daily  GERD diet  NO mints.  Sips of water to soothe throat and avoid throat clearing and coughing  Deslym 2 tsp Twice daily  As needed  Cough  Tessalon Three times a day  As needed cough .  Ventolin inhaler As needed   Restart CPAP At bedtime   Order for New Dreamwear nasal mask.  Follow up in 2-3  months with Dr.  Or Jocie Meroney NP and As needed   Please contact office for sooner follow up if symptoms do not improve or worsen or seek emergency care

## 2020-06-08 NOTE — Progress Notes (Signed)
@Patient  ID: Darlene Harvey, female    DOB: May 27, 1961, 60 y.o.   MRN: [id number]  Chief Complaint  Patient presents with  . Follow-up    Referring provider: Kathyrn Lass, MD  HPI: 60 year old female followed for OSA . 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 Lab work showed absolute eosinophil count at 300.  IgE was 80.  Her hemoglobin was slightly elevated at 15.2   06/08/2020 Follow up : Asthma, cough ,OSA  Patient returns for 1 month follow up . Patient seen last office visit with chronic cough x 2 years. Worse with prolonged talking and mealtime, bedtime. PFT completed last office visit showed normal lung function.  She was started on Zyrtec, PPI/Pepcid , delsym /tessalon. Says she is doing so much better. Cough has resolved. No longer using budesonide. Did not need delsym or tessalon for couple of days.   Has history of moderate OSA . Has not been wearing CPAP for last year due to cough . Has tried full face and nasal mask -does not like either.  We discussed dream wear nasal mask , she wants to try this mask.  Patient education on OSA and CPAP . Denies snoring . No naps . Does feel tired waking up in am.        Allergies  Allergen Reactions  . Bee Venom Anaphylaxis  . Ciprofloxacin Hives and Shortness Of Breath  . Macrobid [Nitrofurantoin] Anaphylaxis  . Metrizamide Anaphylaxis  . Penicillins Hives, Shortness Of Breath and Other (See Comments)    Has patient had a PCN reaction causing immediate rash, facial/tongue/throat swelling, SOB or lightheadedness with hypotension: Yes Has patient had a PCN reaction causing severe rash involving mucus membranes or skin necrosis: No Has patient had a PCN reaction that required hospitalization: No Has  patient had a PCN reaction occurring within the last 10 years: No If all of the above answers are "NO", then may proceed with Cephalosporin use.  . Red Dye Hives and Shortness Of Breath  . Sulfa Antibiotics Hives and Shortness Of Breath  . Iodinated Diagnostic Agents Hives  . Oxycodone Nausea And Vomiting  . Ketoconazole Rash    Immunization History  Administered Date(s) Administered  . PFIZER SARS-COV-2 Vaccination 09/05/2019, 09/27/2019, 06/04/2020    Past Medical History:  Diagnosis Date  . Asthma   . Complication of anesthesia    slow to awaken  . Constipation   . Family history of adverse reaction to anesthesia    father- NV difficulty to wake up  . Head injury with loss of consciousness ([place])   . Heart murmur    was evaluated by Dr Nadyne Coombes  . High cholesterol   . History of hiatal hernia   . Melanoma ([place])   . OSA (obstructive sleep apnea) 07/02/2013   CPAP  . Pneumonia   . PONV (postoperative nausea and vomiting)   . Retinal tear   . Vasomotor instability    Hypotension with exercise    Tobacco History: Social History   Tobacco Use  Smoking Status Never Smoker  Smokeless Tobacco Never Used   Counseling given: Not Answered   Outpatient Medications Prior to Visit  Medication Sig Dispense Refill  . Albuterol Sulfate (PROAIR RESPICLICK) [id number] (90 Base) MCG/ACT AEPB Inhale 2 puffs  into the lungs every 6 (six) hours as needed (for wheezing/shortness of breath).    . cetirizine (ZYRTEC) 10 MG chewable tablet Chew 10 mg by mouth daily.    . famotidine (PEPCID) 20 MG tablet Take 20 mg by mouth daily.    . magnesium gluconate (MAGONATE) 500 MG tablet Take 500 mg by mouth daily as needed (constipation).    . NON FORMULARY hormone replacement pellets injected subcutaneous every 4 to 5 months    . omeprazole (PRILOSEC) 20 MG capsule Take 20 mg by mouth daily.    . benzonatate (TESSALON) 200 MG capsule Take 1 capsule (200 mg total) by mouth 3 (three) times daily as needed  for cough. (Patient not taking: Reported on 06/08/2020) 45 capsule 1  . budesonide (PULMICORT) 180 MCG/ACT inhaler Inhale 2 puffs into the lungs in the morning and at bedtime. (Patient not taking: Reported on 06/08/2020)    . EPINEPHrine (EPIPEN 2-PAK) 0.3 mg/0.3 mL IJ SOAJ injection Inject 0.3 mLs (0.3 mg total) into the muscle once as needed (for severe allergic reaction). CAll 911 immediately if you have to use this medicine (Patient not taking: Reported on 06/08/2020) 1 Device 0   No facility-administered medications prior to visit.     Review of Systems:   Constitutional:   No  weight loss, night sweats,  Fevers, chills,  +fatigue, or  lassitude.  HEENT:   No headaches,  Difficulty swallowing,  Tooth/dental problems, or  Sore throat,                No sneezing, itching, ear ache, nasal congestion, post nasal drip,   CV:  No chest pain,  Orthopnea, PND, swelling in lower extremities, anasarca, dizziness, palpitations, syncope.   GI  No heartburn, indigestion, abdominal pain, nausea, vomiting, diarrhea, change in bowel habits, loss of appetite, bloody stools.   Resp: No shortness of breath with exertion or at rest.  No excess mucus, no productive cough,  No non-productive cough,  No coughing up of blood.  No change in color of mucus.  No wheezing.  No chest wall deformity  Skin: no rash or lesions.  GU: no dysuria, change in color of urine, no urgency or frequency.  No flank pain, no hematuria   MS:  No joint pain or swelling.  No decreased range of motion.  No back pain.    Physical Exam  BP 130/86 (BP Location: Left Arm, Patient Position: Sitting, Cuff Size: Normal)   Pulse 85   Temp 98.1 F (36.7 C) (Temporal)   Ht 5' (1.524 m)   Wt 176 lb 9.6 oz (80.1 kg)   SpO2 97%   BMI 34.49 kg/m   GEN: A/Ox3; pleasant , NAD, well nourished    HEENT:  [place]/AT,  NOSE-clear, THROAT-clear, no lesions, no postnasal drip or exudate noted.   NECK:  Supple w/ fair ROM; no JVD; normal carotid  impulses w/o bruits; no thyromegaly or nodules palpated; no lymphadenopathy.    RESP  Clear  P & A; w/o, wheezes/ rales/ or rhonchi. no accessory muscle use, no dullness to percussion  CARD:  RRR, no m/r/g, no peripheral edema, pulses intact, no cyanosis or clubbing.  GI:   Soft & nt; nml bowel sounds; no organomegaly or masses detected.   Musco: Warm bil, no deformities or joint swelling noted.   Neuro: alert, no focal deficits noted.    Skin: Warm, no lesions or rashes     BNP No results found for: BNP  ProBNP  No results found for: PROBNP  Imaging: No results found.    PFT Results Latest Ref Rng & Units 05/08/2020  FVC-Pre L 2.76  FVC-Predicted Pre % 94  FVC-Post L 2.73  FVC-Predicted Post % 93  Pre FEV1/FVC % % 80  Post FEV1/FCV % % 84  FEV1-Pre L 2.21  FEV1-Predicted Pre % 97  FEV1-Post L 2.28  DLCO uncorrected ml/min/mmHg 18.63  DLCO UNC% % 104  DLCO corrected ml/min/mmHg 17.72  DLCO COR %Predicted % 99  DLVA Predicted % 94  TLC L 5.41  TLC % Predicted % 121  RV % Predicted % 120    No results found for: NITRICOXIDE      Assessment & Plan:   Asthma Allergic asthma with UACS - much improved with cough control regimen , GERD/AR prevention .  Albuterol As needed   Off Budesonide for now - if sx flare may need to restart for maintenance.  (previous Eos 300 /IgE 80)   Plan  Patient Instructions  Zyrtec 10mg  At bedtime   Continue on Prilosec 20mg  daily before meal .  Continue on Pepcid 20mg  daily  GERD diet  NO mints.  Sips of water to soothe throat and avoid throat clearing and coughing  Deslym 2 tsp Twice daily  As needed  Cough  Tessalon Three times a day  As needed cough .  Ventolin inhaler As needed   Restart CPAP At bedtime   Order for New Dreamwear nasal mask.  Follow up in 2-3  months with Dr. Halford Chessman  Or Parrett NP and As needed   Please contact office for sooner follow up if symptoms do not improve or worsen or seek emergency care        OSA (obstructive sleep apnea) Moderate OSA - restart CPAP  Trial of dreamwear nasal mask   Plan  Patient Instructions  Zyrtec 10mg  At bedtime   Continue on Prilosec 20mg  daily before meal .  Continue on Pepcid 20mg  daily  GERD diet  NO mints.  Sips of water to soothe throat and avoid throat clearing and coughing  Deslym 2 tsp Twice daily  As needed  Cough  Tessalon Three times a day  As needed cough .  Ventolin inhaler As needed   Restart CPAP At bedtime   Order for New Dreamwear nasal mask.  Follow up in 2-3  months with Dr. Halford Chessman  Or Parrett NP and As needed   Please contact office for sooner follow up if symptoms do not improve or worsen or seek emergency care          Rexene Edison, NP 06/08/2020

## 2020-06-08 NOTE — Assessment & Plan Note (Signed)
Allergic asthma with UACS - much improved with cough control regimen , GERD/AR prevention .  Albuterol As needed   Off Budesonide for now - if sx flare may need to restart for maintenance.  (previous Eos 300 /IgE 80)   Plan  Patient Instructions  Zyrtec 10mg  At bedtime   Continue on Prilosec 20mg  daily before meal .  Continue on Pepcid 20mg  daily  GERD diet  NO mints.  Sips of water to soothe throat and avoid throat clearing and coughing  Deslym 2 tsp Twice daily  As needed  Cough  Tessalon Three times a day  As needed cough .  Ventolin inhaler As needed   Restart CPAP At bedtime   Order for New Dreamwear nasal mask.  Follow up in 2-3  months with Dr.  Or Laytoya Ion NP and As needed   Please contact office for sooner follow up if symptoms do not improve or worsen or seek emergency care

## 2020-06-09 NOTE — Progress Notes (Signed)
Reviewed and agree with assessment/plan.   Keisa Blow, MD [facility] 06/09/2020, 9:14 AM Pager:  [phone]  

## 2020-06-23 ENCOUNTER — Other Ambulatory Visit: Payer: BC Managed Care – PPO

## 2020-06-25 ENCOUNTER — Other Ambulatory Visit: Payer: BC Managed Care – PPO

## 2020-06-25 DIAGNOSIS — Z20822 Contact with and (suspected) exposure to covid-19: Secondary | ICD-10-CM

## 2020-06-27 LAB — NOVEL CORONAVIRUS, NAA: SARS-CoV-2, NAA: NOT DETECTED

## 2020-06-27 LAB — SARS-COV-2, NAA 2 DAY TAT

## 2020-09-10 ENCOUNTER — Other Ambulatory Visit: Payer: Self-pay

## 2020-09-10 ENCOUNTER — Ambulatory Visit: Payer: BC Managed Care – PPO | Admitting: Adult Health

## 2020-09-10 ENCOUNTER — Encounter: Payer: Self-pay | Admitting: Adult Health

## 2020-09-10 VITALS — BP 118/76 | HR 83 | Temp 97.4°F | Ht 60.0 in | Wt 179.2 lb

## 2020-09-10 DIAGNOSIS — R058 Other specified cough: Secondary | ICD-10-CM

## 2020-09-10 DIAGNOSIS — G4733 Obstructive sleep apnea (adult) (pediatric): Secondary | ICD-10-CM | POA: Diagnosis not present

## 2020-09-10 NOTE — Assessment & Plan Note (Signed)
Mild intermittent asthma with upper airway cough syndrome.  Much improved on current regimen with treatment aimed at trigger prevention with chronic rhinitis and GERD.  Cough has totally resolved on present regimen. For now we will continue on Prilosec and Pepcid along with Zyrtec.  On return visit we will try to wean off of Prilosec and Pepcid slowly .  Have advised her to begin a GERD diet.  If unable to wean off then will refer to GI for further evaluation  Plan  Patient Instructions  Zyrtec 10mg  At bedtime   Continue on Prilosec 20mg  daily before meal .  Continue on Pepcid 20mg  daily  GERD diet  NO mints.  Sips of water to soothe throat and avoid throat clearing and coughing  Deslym 2 tsp Twice daily  As needed  Cough  Tessalon Three times a day  As needed cough .  Ventolin inhaler As needed    Restart CPAP At bedtime   Order for New Dreamwear nasal mask. (Try [email or web address])  Will order for new DME  Follow up in 3  months with Dr. Halford Chessman  Or Falana Clagg NP and As needed   Please contact office for sooner follow up if symptoms do not improve or worsen or seek emergency care

## 2020-09-10 NOTE — Progress Notes (Signed)
@Patient  ID: Darlene Harvey, female    DOB: [birth year], 60 y.o.   MRN: [id number]  Chief Complaint  Patient presents with  . 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 Lab work showed absolute eosinophil count at 300. IgE was 80. Her hemoglobin was slightly elevated at 15.2   09/10/20 Follow up: Asthma , Cough , OSA  Patient returns for a 2-month follow-up.  Patient has mild intermittent asthma and chronic cough.  She had been having an ongoing cough over the last 2 years.  Is seem to be worst after prolonged talking and at mealtime and bedtime.  Pulmonary function testing in December 2021 showed normal lung function.  Patient was started on cough control and trigger prevention regimen with Zyrtec, Prilosec, Pepcid, Delsym and Tessalon.  Patient says this regimen worked exceptionally well.  Cough totally went away.  She had stopped using her budesonide inhaler.  And had rare use of her albuterol inhaler.  She has no longer needed any Delsym or Tessalon.  Patient says she did try to stop taking her Prilosec and Pepcid however as soon as she stopped this her cough came back.  Patient says she is worried about taking these medications long-term due to the potential side effects from long-term GERD treatments.  Patient education was given.  Patient says she is doing well breathing is doing well no shortness of breath or cough.  Patient has moderate obstructive sleep apnea.  Has not been wearing her CPAP recently due to cough and issues with her mask.  Patient had recently tried to restart her CPAP but has not been able to get a mask from her local homecare company.  She  has tried to contact them multiple times and has had no success.  Today we discussed trial of a DreamWear nasal mask.  Sample was given.  And she will try to reach back out to her homecare company and look online for ordering.  We also are going to place an order to a different homecare company to see if she can switch services. She does have some ongoing daytime sleepiness since not wearing her CPAP.  Patient says she is been under a lot of stress with her work.  And also she has adult children.  And one of her daughters has cancer and is undergoing treatment which was been very stressful.   Allergies  Allergen Reactions  . Bee Venom Anaphylaxis  . Ciprofloxacin Hives and Shortness Of Breath  . Macrobid [Nitrofurantoin] Anaphylaxis  . Metrizamide Anaphylaxis  . Penicillins Hives, Shortness Of Breath and Other (See Comments)    Has patient had a PCN reaction causing immediate rash, facial/tongue/throat swelling, SOB or lightheadedness with hypotension: Yes Has patient had a PCN reaction causing severe rash involving mucus membranes or skin necrosis: No Has patient had a PCN reaction that required hospitalization: No Has patient had a PCN reaction occurring within the last 10 years: No If all of the above answers are "NO", then may proceed with Cephalosporin use.  . Red Dye Hives and Shortness Of Breath  . Sulfa Antibiotics Hives and Shortness Of Breath  . Iodinated Diagnostic Agents Hives  . Oxycodone Nausea And Vomiting  .  Ketoconazole Rash    Immunization History  Administered Date(s) Administered  . PFIZER(Purple Top)SARS-COV-2 Vaccination 09/05/2019, 09/27/2019, 06/04/2020    Past Medical History:  Diagnosis Date  . Asthma   . Complication of anesthesia    slow to awaken  . Constipation   . Family history of adverse reaction to anesthesia    father- NV difficulty to wake up  . Head injury with loss of consciousness ([place])   . Heart murmur    was evaluated by Dr Nadyne Coombes  .  High cholesterol   . History of hiatal hernia   . Melanoma ([place])   . OSA (obstructive sleep apnea) 07/02/2013   CPAP  . Pneumonia   . PONV (postoperative nausea and vomiting)   . Retinal tear   . Vasomotor instability    Hypotension with exercise    Tobacco History: Social History   Tobacco Use  Smoking Status Never Smoker  Smokeless Tobacco Never Used   Counseling given: Not Answered   Outpatient Medications Prior to Visit  Medication Sig Dispense Refill  . cetirizine (ZYRTEC) 10 MG chewable tablet Chew 10 mg by mouth daily.    . famotidine (PEPCID) 20 MG tablet Take 20 mg by mouth daily.    . magnesium gluconate (MAGONATE) 500 MG tablet Take 500 mg by mouth daily as needed (constipation).    . NON FORMULARY hormone replacement pellets injected subcutaneous every 4 to 5 months    . omeprazole (PRILOSEC) 20 MG capsule Take 20 mg by mouth daily.    . Albuterol Sulfate (PROAIR RESPICLICK) 382 (90 Base) MCG/ACT AEPB Inhale 2 puffs into the lungs every 6 (six) hours as needed (for wheezing/shortness of breath). (Patient not taking: Reported on 09/10/2020)    . benzonatate (TESSALON) 200 MG capsule Take 1 capsule (200 mg total) by mouth 3 (three) times daily as needed for cough. (Patient not taking: No sig reported) 45 capsule 1  . budesonide (PULMICORT) 180 MCG/ACT inhaler Inhale 2 puffs into the lungs in the morning and at bedtime. (Patient not taking: No sig reported)    . EPINEPHrine (EPIPEN 2-PAK) 0.3 mg/0.3 mL IJ SOAJ injection Inject 0.3 mLs (0.3 mg total) into the muscle once as needed (for severe allergic reaction). CAll 911 immediately if you have to use this medicine (Patient not taking: No sig reported) 1 Device 0   No facility-administered medications prior to visit.     Review of Systems:   Constitutional:   No  weight loss, night sweats,  Fevers, chills,  +fatigue, or  lassitude.  HEENT:   No headaches,  Difficulty swallowing,  Tooth/dental problems, or  Sore throat,                 No sneezing, itching, ear ache, nasal congestion, post nasal drip,   CV:  No chest pain,  Orthopnea, PND, swelling in lower extremities, anasarca, dizziness, palpitations, syncope.   GI  No heartburn, indigestion, abdominal pain, nausea, vomiting, diarrhea, change in bowel habits, loss of appetite, bloody stools.   Resp: No shortness of breath with exertion or at rest.  No excess mucus, no productive cough,  No non-productive cough,  No coughing up of blood.  No change in color of mucus.  No wheezing.  No chest wall deformity  Skin: no rash or lesions.  GU: no dysuria, change in color of urine, no urgency or frequency.  No flank pain, no hematuria   MS:  No joint pain or swelling.  No decreased range of  motion.  No back pain.    Physical Exam  BP 118/76 (BP Location: Left Arm, Cuff Size: Normal)   Pulse 83   Temp (!) 97.4 F (36.3 C) (Temporal)   Ht 5' (1.524 m)   Wt 179 lb 3.2 oz (81.3 kg)   SpO2 96% Comment: RA  BMI 35.00 kg/m   GEN: A/Ox3; pleasant , NAD, well nourished    HEENT:  [place]/AT, NOSE-clear, THROAT-clear, no lesions, no postnasal drip or exudate noted.   NECK:  Supple w/ fair ROM; no JVD; normal carotid impulses w/o bruits; no thyromegaly or nodules palpated; no lymphadenopathy.    RESP  Clear  P & A; w/o, wheezes/ rales/ or rhonchi. no accessory muscle use, no dullness to percussion  CARD:  RRR, no m/r/g, no peripheral edema, pulses intact, no cyanosis or clubbing.  GI:   Soft & nt; nml bowel sounds; no organomegaly or masses detected.   Musco: Warm bil, no deformities or joint swelling noted.   Neuro: alert, no focal deficits noted.    Skin: Warm, no lesions or rashes    Lab Results:  CBC    Component Value Date/Time   WBC 8.6 04/07/2020 1627   RBC 5.14 (H) 04/07/2020 1627   HGB 15.2 (H) 04/07/2020 1627   HCT 45.3 04/07/2020 1627   PLT 304.0 04/07/2020 1627   MCV 88.1 04/07/2020 1627   MCHC 33.6 04/07/2020 1627   RDW 13.1  04/07/2020 1627   LYMPHSABS 3.1 04/07/2020 1627   MONOABS 0.6 04/07/2020 1627   EOSABS 0.3 04/07/2020 1627   BASOSABS 0.1 04/07/2020 1627    BMET  BNP No results found for: BNP  ProBNP No results found for: PROBNP  Imaging: No results found.    PFT Results Latest Ref Rng & Units 05/08/2020  FVC-Pre L 2.76  FVC-Predicted Pre % 94  FVC-Post L 2.73  FVC-Predicted Post % 93  Pre FEV1/FVC % % 80  Post FEV1/FCV % % 84  FEV1-Pre L 2.21  FEV1-Predicted Pre % 97  FEV1-Post L 2.28  DLCO uncorrected ml/min/mmHg 18.63  DLCO UNC% % 104  DLCO corrected ml/min/mmHg 17.72  DLCO COR %Predicted % 99  DLVA Predicted % 94  TLC L 5.41  TLC % Predicted % 121  RV % Predicted % 120    No results found for: NITRICOXIDE      Assessment & Plan:   Upper airway cough syndrome Mild intermittent asthma with upper airway cough syndrome.  Much improved on current regimen with treatment aimed at trigger prevention with chronic rhinitis and GERD.  Cough has totally resolved on present regimen. For now we will continue on Prilosec and Pepcid along with Zyrtec.  On return visit we will try to wean off of Prilosec and Pepcid slowly .  Have advised her to begin a GERD diet.  If unable to wean off then will refer to GI for further evaluation  Plan  Patient Instructions  Zyrtec 10mg  At bedtime   Continue on Prilosec 20mg  daily before meal .  Continue on Pepcid 20mg  daily  GERD diet  NO mints.  Sips of water to soothe throat and avoid throat clearing and coughing  Deslym 2 tsp Twice daily  As needed  Cough  Tessalon Three times a day  As needed cough .  Ventolin inhaler As needed    Restart CPAP At bedtime   Order for New Dreamwear nasal mask. (Try [employer])  Will order for new DME  Follow up in 3  months with Dr. Halford Chessman  Or Areona Homer NP and As needed   Please contact office for sooner follow up if symptoms do not improve or worsen or seek emergency care       OSA (obstructive sleep  apnea) Patient education given on sleep apnea and potential complications of untreated sleep apnea.  Patient was given a sample of a DreamWear nasal mask.  And information on [employer] to see if she can order additional mask there.  Also will send an order to a new DME to see if she can switch her CPAP supplies there since she has been unable to get it her current DME.  Plan  Patient Instructions  Zyrtec 10mg  At bedtime   Continue on Prilosec 20mg  daily before meal .  Continue on Pepcid 20mg  daily  GERD diet  NO mints.  Sips of water to soothe throat and avoid throat clearing and coughing  Deslym 2 tsp Twice daily  As needed  Cough  Tessalon Three times a day  As needed cough .  Ventolin inhaler As needed    Restart CPAP At bedtime   Order for New Dreamwear nasal mask. (Try [employer])  Will order for new DME  Follow up in 3  months with Dr. Halford Chessman  Or Jose Corvin NP and As needed   Please contact office for sooner follow up if symptoms do not improve or worsen or seek emergency care         Rexene Edison, NP 09/10/2020

## 2020-09-10 NOTE — Patient Instructions (Addendum)
Zyrtec 10mg  At bedtime   Continue on Prilosec 20mg  daily before meal .  Continue on Pepcid 20mg  daily  GERD diet  NO mints.  Sips of water to soothe throat and avoid throat clearing and coughing  Deslym 2 tsp Twice daily  As needed  Cough  Tessalon Three times a day  As needed cough .  Ventolin inhaler As needed    Restart CPAP At bedtime   Order for New Dreamwear nasal mask. (Try [employer])  Will order for new DME  Follow up in 3  months with Dr. Halford Harvey  Or Darlene Rosch NP and As needed   Please contact office for sooner follow up if symptoms do not improve or worsen or seek emergency care

## 2020-09-10 NOTE — Assessment & Plan Note (Signed)
Patient education given on sleep apnea and potential complications of untreated sleep apnea.  Patient was given a sample of a DreamWear nasal mask.  And information on [employer] to see if she can order additional mask there.  Also will send an order to a new DME to see if she can switch her CPAP supplies there since she has been unable to get it her current DME.  Plan  Patient Instructions  Zyrtec 10mg  At bedtime   Continue on Prilosec 20mg  daily before meal .  Continue on Pepcid 20mg  daily  GERD diet  NO mints.  Sips of water to soothe throat and avoid throat clearing and coughing  Deslym 2 tsp Twice daily  As needed  Cough  Tessalon Three times a day  As needed cough .  Ventolin inhaler As needed    Restart CPAP At bedtime   Order for New Dreamwear nasal mask. (Try [employer])  Will order for new DME  Follow up in 3  months with Dr. Halford Chessman  Or Brand Siever NP and As needed   Please contact office for sooner follow up if symptoms do not improve or worsen or seek emergency care

## 2020-09-11 NOTE — Progress Notes (Signed)
Reviewed and agree with assessment/plan.   Neko Boyajian, MD [facility] 09/11/2020, 8:19 AM Pager:  [phone]  

## 2021-02-24 ENCOUNTER — Ambulatory Visit: Payer: BC Managed Care – PPO | Admitting: Pulmonary Disease

## 2021-02-24 ENCOUNTER — Encounter: Payer: Self-pay | Admitting: Pulmonary Disease

## 2021-02-24 ENCOUNTER — Other Ambulatory Visit: Payer: Self-pay

## 2021-02-24 VITALS — BP 120/82 | HR 82 | Temp 98.4°F | Ht 60.0 in | Wt 166.6 lb

## 2021-02-24 DIAGNOSIS — R55 Syncope and collapse: Secondary | ICD-10-CM

## 2021-02-24 DIAGNOSIS — G4733 Obstructive sleep apnea (adult) (pediatric): Secondary | ICD-10-CM

## 2021-02-24 NOTE — Progress Notes (Signed)
[facility]  Chief Complaint  Patient presents with   Follow-up    Wearing CPAP,avg. 4 1/2 hrs. Each night    Constitutional:  BP 120/82 (BP Location: Left Arm, Cuff Size: Normal)   Pulse 82   Temp 98.4 F (36.9 C) (Temporal)   Ht 5' (1.524 m)   Wt 166 lb 9.6 oz (75.6 kg)   SpO2 94%   BMI 32.54 kg/m   Past Medical History:  Asthma, HLD, Melanoma, Vasomotor instability, Hiatal hernia, Pneumonia  Past Surgical History:  Her  has a past surgical history that includes Abdominal hysterectomy; Rectal vaginal reconstruction; Colonoscopy w/ polypectomy; Shoulder surgery (Left); Wrist surgery (Right); ORIF ankle fracture (Left, 04/03/2018); and ORIF ankle fracture (Left, 04/03/2018).  Brief Summary:  Darlene Harvey is a 60 y.o. female with obstructive sleep apnea and asthma.      Subjective:   PFT from December was normal.  She is still using her old CPAP machine.  She was told she needed a download by her DME before she could get new supplies.  However, her device is so old that download is not longer functioning.  Her cough is much better.  No longer using inhalers or reflux medications.  She was seen by Dr. Einar Gip with cardiology several years ago for syncope from vasomotor dilation.  She has been getting more frequent episodes of syncope over the past several weeks.  This is associate with rapid heart rate up to 180's and then sudden drop into the 50's.  She feels palpitations and shortness of breath during these episodes.  Physical Exam:    Appearance - well kempt   ENMT - no sinus tenderness, no oral exudate, no LAN, Mallampati 3 airway, no stridor  Respiratory - equal breath sounds bilaterally, no wheezing or rales  CV - s1s2 regular rate and rhythm, no murmurs  Ext - no clubbing, no edema  Skin - no rashes  Psych - normal mood and affect    Pulmonary testing:  PFT 05/08/20 >> FEV1 2.28 (100%), FEV1% 84, TLC 5.41  (121%), DLCO 104% IgE 04/07/20 >> 80  Sleep Tests:  PSG 08/02/13 >> AHI 13.8, SpO2 low 90% Auto CPAP 04/19/16 to 07/17/16 >> used on 38 of 90 nights with average 5 hrs 10 min.  Average AHI 0.9 with median CPAP 6 and 95 th percentile CPAP 7 cm H2O  Social History:  She  reports that she has never smoked. She has never used smokeless tobacco. She reports current alcohol use of about 1.0 standard drink per week. She reports that she does not use drugs.  Family History:  Her family history includes Allergies in her mother; Asthma in her mother; Cancer in her mother; Cancer - Prostate in her father.     Assessment/Plan:   Chronic cough. - likely combination of allergic rhinitis with post nasal drip, asthma, and reflux - improved - monitor off therapy  Obstructive sleep apnea. - she is compliant with CPAP and reports benefit from therapy - she uses [employer] for her DME - her current machine is more than 58 yrs old - will arrange for new Resmed auto CPAP device with range 5 to 15 cm H2O and new supplies - she might need repeat home sleep study prior to having insurance cover new supplies  Syncope. - will arrange for referral back to Dr. Adrian Prows to further assess  Time Spent Involved in Patient Care on Day of Examination:  32 minutes  Follow up:   Patient Instructions  Will arrange for referral to Dr. Adrian Prows with cardiology  Will arrange for new CPAP supplies and new Resmed CPAP machine  Follow up in 6 months  Medication List:   Allergies as of 02/24/2021       Reactions   Bee Venom Anaphylaxis   Ciprofloxacin Hives, Shortness Of Breath   Macrobid [nitrofurantoin] Anaphylaxis   Metrizamide Anaphylaxis   Penicillins Hives, Shortness Of Breath, Other (See Comments)   Has patient had a PCN reaction causing immediate rash, facial/tongue/throat swelling, SOB or lightheadedness with hypotension: Yes Has patient had a PCN reaction causing severe rash involving mucus membranes or  skin necrosis: No Has patient had a PCN reaction that required hospitalization: No Has patient had a PCN reaction occurring within the last 10 years: No If all of the above answers are "NO", then may proceed with Cephalosporin use.   Red Dye Hives, Shortness Of Breath   Sulfa Antibiotics Hives, Shortness Of Breath   Iodinated Diagnostic Agents Hives   Oxycodone Nausea And Vomiting   Ketoconazole Rash        Medication List        Accurate as of February 24, 2021  9:26 AM. If you have any questions, ask your nurse or doctor.          STOP taking these medications    Albuterol Sulfate 108 (90 Base) MCG/ACT Aepb Commonly known as: PROAIR RESPICLICK Stopped by: Chesley Mires, MD   benzonatate 200 MG capsule Commonly known as: TESSALON Stopped by: Chesley Mires, MD   budesonide 180 MCG/ACT inhaler Commonly known as: PULMICORT Stopped by: Chesley Mires, MD   famotidine 20 MG tablet Commonly known as: PEPCID Stopped by: Chesley Mires, MD   omeprazole 20 MG capsule Commonly known as: PRILOSEC Stopped by: Chesley Mires, MD       TAKE these medications    cetirizine 10 MG chewable tablet Commonly known as: ZYRTEC Chew 10 mg by mouth daily.   EPINEPHrine 0.3 mg/0.3 mL Soaj injection Commonly known as: EpiPen 2-Pak Inject 0.3 mLs (0.3 mg total) into the muscle once as needed (for severe allergic reaction). CAll 911 immediately if you have to use this medicine   magnesium gluconate 500 MG tablet Commonly known as: MAGONATE Take 500 mg by mouth daily as needed (constipation).   NON FORMULARY hormone replacement pellets injected subcutaneous every 4 to 5 months   PROGESTERONE PO Take by mouth. As directed        Signature:  Chesley Mires, MD [place] Pager - [phone] 02/24/2021, 9:26 AM

## 2021-02-24 NOTE — Patient Instructions (Signed)
Will arrange for referral to Dr. Adrian Prows with cardiology  Will arrange for new CPAP supplies and new Resmed CPAP machine  Follow up in 6 months

## 2021-03-09 ENCOUNTER — Encounter: Payer: Self-pay | Admitting: Cardiology

## 2021-03-09 ENCOUNTER — Other Ambulatory Visit: Payer: Self-pay

## 2021-03-09 ENCOUNTER — Inpatient Hospital Stay: Payer: BC Managed Care – PPO

## 2021-03-09 ENCOUNTER — Ambulatory Visit: Payer: BC Managed Care – PPO | Admitting: Cardiology

## 2021-03-09 VITALS — BP 142/86 | HR 79 | Temp 98.5°F | Resp 17 | Ht 60.0 in | Wt 168.4 lb

## 2021-03-09 DIAGNOSIS — R55 Syncope and collapse: Secondary | ICD-10-CM

## 2021-03-09 DIAGNOSIS — E7800 Pure hypercholesterolemia, unspecified: Secondary | ICD-10-CM

## 2021-03-09 DIAGNOSIS — G4733 Obstructive sleep apnea (adult) (pediatric): Secondary | ICD-10-CM

## 2021-03-09 DIAGNOSIS — R030 Elevated blood-pressure reading, without diagnosis of hypertension: Secondary | ICD-10-CM

## 2021-03-09 NOTE — Progress Notes (Signed)
Primary Physician/Referring:  Kathyrn Lass, MD  Patient ID: Darlene Harvey, female    DOB: 05-11-1961, 60 y.o.   MRN: [id number]  Chief Complaint  Patient presents with   New Patient (Initial Visit)   Loss of Consciousness   HPI:    Darlene Harvey  is a 60 y.o. Caucasian female patient with OSA on CPAP, who I had last seen in 2015 for palpitations and fatigue, sleep study at that time had confirmed presence of OSA.  She is now being followed by Dr. Chesley Mires and now referred for evaluation of recurrent episodes of syncope.  Her past medical history is also significant for mild hyperlipidemia.  Over the past 3 weeks she has been having frequent episodes of rapid heartbeat followed by near syncopal spells, occurring almost on a daily basis.  She has not had any frank syncope.  Episodes mostly occur either when she is sitting or when she is standing but never when she is laying down.  She does feel symptoms coming on prior to the episodes and feels warmth in her years.  No chest pain, dyspnea, neurologic deficits.  About a year ago she had an accidental fall and had bilateral ankle fractures and had gained 50 pounds in weight which she is trying to now lose again.  Past Medical History:  Diagnosis Date   Asthma    Complication of anesthesia    slow to awaken   Constipation    Family history of adverse reaction to anesthesia    father- [place] difficulty to wake up   Head injury with loss of consciousness ([place])    Heart murmur    was evaluated by Dr Nadyne Coombes   High cholesterol    History of hiatal hernia    Melanoma ([place])    OSA (obstructive sleep apnea) 07/02/2013   CPAP   Pneumonia    PONV (postoperative nausea and vomiting)    Retinal tear    Vasomotor instability    Hypotension with exercise   Past Surgical History:  Procedure Laterality Date   ABDOMINAL HYSTERECTOMY     COLONOSCOPY W/ POLYPECTOMY     NERVE TRANSFER     ELBOW   ORIF ANKLE FRACTURE Left 04/03/2018   ORIF ANKLE  FRACTURE Left 04/03/2018   Procedure: OPEN REDUCTION INTERNAL FIXATION (ORIF) OF LEFT  ANKLE FRACTURE;  Surgeon: Erle Crocker, MD;  Location: [facility];  Service: Orthopedics;  Laterality: Left;   Rectal vaginal reconstruction     SHOULDER SURGERY Left    reconstration   WRIST SURGERY Right    fracture   Family History  Problem Relation Age of Onset   Asthma Mother    Allergies Mother    Cancer Mother        stomach   Cancer - Prostate Father    Cancer - Prostate Brother 34    Social History   Tobacco Use   Smoking status: Never   Smokeless tobacco: Never  Substance Use Topics   Alcohol use: Yes    Alcohol/week: 1.0 standard drink    Types: 1 Glasses of wine per week    Comment: OCC   Marital Status: Married  ROS  Review of Systems  Cardiovascular:  Positive for palpitations and syncope. Negative for chest pain, dyspnea on exertion and leg swelling.  Respiratory:  Positive for snoring.   Gastrointestinal:  Negative for melena.  Objective  Blood pressure (!) 142/86, pulse 79, temperature 98.5 F (36.9 C), temperature source Temporal, resp. rate  17, height 5' (1.524 m), weight 168 lb 6.4 oz (76.4 kg), SpO2 98 %. Body mass index is 32.89 kg/m.  Vitals with BMI 03/09/2021 02/24/2021 09/10/2020  Height 5\' 0"  5\' 0"  5\' 0"   Weight 168 lbs 6 oz 166 lbs 10 oz 179 lbs 3 oz  BMI 67.12 45.80 35  Systolic 998 338 250  Diastolic 86 82 76  Pulse 79 82 83     Physical Exam Constitutional:      Appearance: She is obese.  Neck:     Vascular: No carotid bruit or JVD.  Cardiovascular:     Rate and Rhythm: Normal rate and regular rhythm.     Pulses: Intact distal pulses.     Heart sounds: Normal heart sounds. No murmur heard.   No gallop.  Pulmonary:     Effort: Pulmonary effort is normal.     Breath sounds: Normal breath sounds.  Abdominal:     General: Bowel sounds are normal.     Palpations: Abdomen is soft.  Musculoskeletal:        General: No swelling.     Laboratory  examination:   Recent Labs    04/07/20 1627  NA 139  K 4.0  CL 102  CO2 29  GLUCOSE 75  BUN 13  CREATININE 0.88  CALCIUM 9.3   CrCl cannot be calculated (Patient's most recent lab result is older than the maximum 21 days allowed.).  CMP Latest Ref Rng & Units 04/07/2020  Glucose 70 - 99 mg/dL 75  BUN 6 - 23 mg/dL 13  Creatinine 0.40 - 1.20 mg/dL 0.88  Sodium 135 - 145 mEq/L 139  Potassium 3.5 - 5.1 mEq/L 4.0  Chloride 96 - 112 mEq/L 102  CO2 19 - 32 mEq/L 29  Calcium 8.4 - 10.5 mg/dL 9.3  Total Protein 6.0 - 8.3 g/dL 6.9  Total Bilirubin 0.2 - 1.2 mg/dL 0.4  Alkaline Phos 39 - 117 U/L 107  AST 0 - 37 U/L 19  ALT 0 - 35 U/L 22   CBC Latest Ref Rng & Units 04/07/2020  WBC 4.0 - 10.5 K/uL 8.6  Hemoglobin 12.0 - 15.0 g/dL 15.2(H)  Hematocrit 36.0 - 46.0 % 45.3  Platelets 150.0 - 400.0 K/uL 304.0    Lipid Panel No results for input(s): CHOL, TRIG, LDLCALC, VLDL, HDL, CHOLHDL, LDLDIRECT in the last 8760 hours. Lipid Panel  No results found for: CHOL, TRIG, HDL, CHOLHDL, VLDL, LDLCALC, LDLDIRECT, LABVLDL   HEMOGLOBIN A1C No results found for: HGBA1C, MPG TSH No results for input(s): TSH in the last 8760 hours.  External labs:   Cholesterol, total 262.000 M 04/14/2020 HDL 64.000 MG 04/14/2020 LDL 177.000 M 04/14/2020 Triglycerides 120.000 M 04/14/2020  A1C 5.400 % 04/14/2020 TSH 1.890 10/02/2017  Hemoglobin 15.200 g/d 04/07/2020 Platelets 304.000 K/ 04/07/2020  Creatinine, Serum 1.030 MG/ 04/14/2020 Potassium 4.000 mEq 04/07/2020 ALT (SGPT) 24.000 IU/ 04/14/2020  Medications and allergies   Allergies  Allergen Reactions   Bee Venom Anaphylaxis   Ciprofloxacin Hives and Shortness Of Breath   Macrobid [Nitrofurantoin] Anaphylaxis   Metrizamide Anaphylaxis   Penicillins Hives, Shortness Of Breath and Other (See Comments)    Has patient had a PCN reaction causing immediate rash, facial/tongue/throat swelling, SOB or lightheadedness with hypotension: Yes Has patient  had a PCN reaction causing severe rash involving mucus membranes or skin necrosis: No Has patient had a PCN reaction that required hospitalization: No Has patient had a PCN reaction occurring within the last 10 years: No If  all of the above answers are "NO", then may proceed with Cephalosporin use.   Red Dye Hives and Shortness Of Breath   Sulfa Antibiotics Hives and Shortness Of Breath   Diphenhydramine     Other reaction(s): red dye only   Iodinated Diagnostic Agents Hives   Iodine     Other reaction(s): Unknown   Oxycodone Nausea And Vomiting   Ketoconazole Rash     Medication prior to this encounter:   Outpatient Medications Prior to Visit  Medication Sig Dispense Refill   albuterol (VENTOLIN HFA) 108 (90 Base) MCG/ACT inhaler Inhale 1 puff into the lungs as needed.     EPINEPHrine (EPIPEN 2-PAK) 0.3 mg/0.3 mL IJ SOAJ injection Inject 0.3 mLs (0.3 mg total) into the muscle once as needed (for severe allergic reaction). CAll 911 immediately if you have to use this medicine 1 Device 0   magnesium gluconate (MAGONATE) 500 MG tablet Take 500 mg by mouth daily as needed (constipation).     NON FORMULARY hormone replacement pellets injected subcutaneous every 4 to 5 months     cetirizine (ZYRTEC) 10 MG chewable tablet Chew 10 mg by mouth daily.     PROGESTERONE PO Take by mouth. As directed     No facility-administered medications prior to visit.    Medication list after today's encounter   Current Outpatient Medications  Medication Instructions   albuterol (VENTOLIN HFA) 108 (90 Base) MCG/ACT inhaler 1 puff, Inhalation, As needed   EPINEPHrine (EPIPEN 2-PAK) 0.3 mg, Intramuscular, Once PRN, CAll 911 immediately if you have to use this medicine   magnesium gluconate (MAGONATE) 500 mg, Oral, Daily PRN   NON FORMULARY hormone replacement pellets injected subcutaneous every 4 to 5 months    Radiology:   Chest x-ray 04/08/2020: The heart size and mediastinal contours are within  normal limits. Both lungs are clear. The visualized skeletal structures are unremarkable.  Cardiac Studies:   Treadmill stress test. 08/19/2013 : The patient exercised according to the [facility]   protocol, Total time recorded  9 Min. 1 sec. achieving a max heart rate of 151  which was  88% of MPHR for age and  10.0 METS of work. Baseline NIBP was 122/84. Peak NIBP was 160/80 Max Sysp was: 160 MaxDiasp was: 84. The baseline ECG showed NSR, Normal ECG. During exercise there was no ST-T changes of ischemia. Symptoms: THR achieved. Arrhythmia: None.  Indications: Shortness of breath. Conclusions: Negative for ischemia.  Evaluate for non cardiac etiology for dyspnea. Continue primary prevention.  Echocardiogram 08/05/2013: 1. Left ventricular cavity is normal in size.   Normal diastolic filling.   Normal global wall motion.   Normal systolic global function.   Calculated EF 56%. 2. Mitral valve structurally normal.   Mild mitral regurgitation.   3. Tricuspid valve structurally normal.   Mild to moderate tricuspid regurgitation.   No pulmonary hypertension.  EKG:   EKG 03/09/2021: Normal sinus rhythm at rate of 70 bpm, normal axis.  No evidence of ischemia, normal EKG.      Assessment     ICD-10-CM   1. Vasovagal near syncope  R55 EKG 12-Lead    [facility] ECHOCARDIOGRAM COMPLETE    LONG TERM MONITOR (3-14 DAYS)    2. OSA on CPAP  G47.33    Z99.89     3. Pure hypercholesterolemia  E78.00 CT CARDIAC SCORING ([facility] LOCATIONS ONLY)    4. Elevated BP without diagnosis of hypertension  R03.0        Medications Discontinued  During This Encounter  Medication Reason   PROGESTERONE PO Error   cetirizine (ZYRTEC) 10 MG chewable tablet Error    No orders of the defined types were placed in this encounter.  Orders Placed This Encounter  Procedures   CT CARDIAC SCORING ([facility] LOCATIONS ONLY)    Standing Status:   Future    Standing Expiration Date:   05/09/2021    Order Specific Question:   Is patient  pregnant?    Answer:   No    Order Specific Question:   Preferred imaging location?    Answer:   [facility]   LONG TERM MONITOR (3-14 DAYS)    Standing Status:   Future    Standing Expiration Date:   03/09/2022    Order Specific Question:   Where should this test be performed?    Answer:   [facility]    Order Specific Question:   Does the patient have an implanted cardiac device?    Answer:   No    Order Specific Question:   Prescribed days of wear    Answer:   7   EKG 12-Lead   [facility] ECHOCARDIOGRAM COMPLETE    Standing Status:   Future    Standing Expiration Date:   03/09/2022   Recommendations:   Jeanenne Licea is a 60 y.o. Caucasian female patient with OSA on CPAP, who I had last seen in 2015 for palpitations and fatigue, sleep study at that time had confirmed presence of OSA.  She is now being followed by Dr. Chesley Mires and now referred for evaluation of recurrent episodes of syncope.  Her past medical history is also significant for mild hyperlipidemia.  Patient had a mechanical fall and had bilateral ankle fracture and since then had gained about 50 pounds in weight and she is now trying to lose weight.  I reviewed her labs, her LDL is significantly increased to a treatable level.  However she is now making lifestyle changes, we have discussed regarding calorie restrictions again, before starting any medical therapy, would like to obtain coronary calcium score for risk stratification.  She may need repeat lipid profile testing.  With regard to near syncopal spells, her symptoms are very suggestive of vasovagal episodes with rapid heartbeat followed by bradycardia, dizziness and near syncope.  She is able to feel these episodes coming on.  Counterpressure maneuver discussed with the patient.  Will perform Zio patch 1 week monitoring and also obtain an echocardiogram and see her back in 3 to 4 weeks for follow-up.  Her blood pressure was elevated today, we will continue to monitor  this, low-salt diet discussed.    Adrian Prows, MD, [facility] 03/09/2021, 2:03 PM Office: [phone]

## 2021-03-19 ENCOUNTER — Other Ambulatory Visit: Payer: Self-pay

## 2021-03-19 ENCOUNTER — Ambulatory Visit: Payer: BC Managed Care – PPO

## 2021-03-19 DIAGNOSIS — R55 Syncope and collapse: Secondary | ICD-10-CM

## 2021-03-26 ENCOUNTER — Ambulatory Visit
Admission: RE | Admit: 2021-03-26 | Discharge: 2021-03-26 | Disposition: A | Discharge status: Home / Self Care | Payer: No Typology Code available for payment source | Source: Ambulatory Visit | Attending: Cardiology | Admitting: Cardiology

## 2021-03-26 DIAGNOSIS — E7800 Pure hypercholesterolemia, unspecified: Secondary | ICD-10-CM

## 2021-03-26 NOTE — Progress Notes (Signed)
Coronary calcium score 03/26/2021: Total score of 0.  Visualized ascending and descending thoracic aorta normal. The visualized liver demonstrates multiple irregular hypodense structures measuring under 1 cm and scattered throughout right and left lobes. 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 These likely do not require further imaging follow-up unless there is a history of previous malignancy.

## 2021-04-09 ENCOUNTER — Ambulatory Visit: Payer: BC Managed Care – PPO | Admitting: Cardiology

## 2021-04-15 ENCOUNTER — Ambulatory Visit: Payer: BC Managed Care – PPO | Admitting: Cardiology

## 2021-04-15 ENCOUNTER — Other Ambulatory Visit: Payer: Self-pay

## 2021-04-15 ENCOUNTER — Encounter: Payer: Self-pay | Admitting: Cardiology

## 2021-04-15 VITALS — BP 145/88 | HR 78 | Temp 97.9°F | Resp 16 | Ht 60.0 in | Wt 160.4 lb

## 2021-04-15 DIAGNOSIS — R002 Palpitations: Secondary | ICD-10-CM

## 2021-04-15 DIAGNOSIS — R030 Elevated blood-pressure reading, without diagnosis of hypertension: Secondary | ICD-10-CM

## 2021-04-15 DIAGNOSIS — E7800 Pure hypercholesterolemia, unspecified: Secondary | ICD-10-CM

## 2021-04-15 MED ORDER — VERAPAMIL HCL ER 180 MG PO TBCR: 180.0000 mg | EXTENDED_RELEASE_TABLET | Freq: Every day | ORAL | 1 refills | Status: DC

## 2021-04-15 NOTE — Progress Notes (Signed)
Primary Physician/Referring:  Kathyrn Lass, MD  Patient ID: Darlene Harvey, female    DOB: [birth year], 60 y.o.   MRN: [id number]  Chief Complaint  Patient presents with   Loss of Consciousness   Follow-up    4 weeks   Results    Echocardiogram, Heart monitor, calcium score test   HPI:    Darlene Harvey  is a 60 y.o. Caucasian female patient with OSA on CPAP, presents for f/u of near syncope and rapid palpitations. Symptoms started about 2 months ago. Her past medical history is also significant for hyperlipidemia.  Patient was seen by me about 6 weeks ago for these episodes of near syncope, rapid palpitations.  She has not had any further episodes, syncope I felt was probably vasovagal.  Her rapid palpitations symptoms suggest PSVT.  She wore extended EKG monitoring, unfortunately she did not have any episodes and only had few brief episodes that are PACs and PVCs.   Past Medical History:  Diagnosis Date   Asthma    Closed left ankle fracture 73/71/0626   Complication of anesthesia    slow to awaken   Constipation    Family history of adverse reaction to anesthesia    father- NV difficulty to wake up   Head injury with loss of consciousness ([place])    Heart murmur    was evaluated by Dr Nadyne Coombes   High cholesterol    History of hiatal hernia    Melanoma ([place])    OSA (obstructive sleep apnea) 07/02/2013   CPAP   Pneumonia    PONV (postoperative nausea and vomiting)    Retinal tear    Vasomotor instability    Hypotension with exercise   Past Surgical History:  Procedure Laterality Date   ABDOMINAL HYSTERECTOMY     COLONOSCOPY W/ POLYPECTOMY     NERVE TRANSFER     ELBOW   ORIF ANKLE FRACTURE Left 04/03/2018   ORIF ANKLE FRACTURE Left 04/03/2018   Procedure: OPEN REDUCTION INTERNAL FIXATION (ORIF) OF LEFT  ANKLE FRACTURE;  Surgeon: Erle Crocker, MD;  Location: [place];  Service: Orthopedics;  Laterality: Left;   Rectal vaginal reconstruction     SHOULDER SURGERY Left     reconstration   WRIST SURGERY Right    fracture   Family History  Problem Relation Age of Onset   Asthma Mother    Allergies Mother    Cancer Mother        stomach   Cancer - Prostate Father    Cancer - Prostate Brother 42    Social History   Tobacco Use   Smoking status: Never   Smokeless tobacco: Never  Substance Use Topics   Alcohol use: Yes    Alcohol/week: 1.0 standard drink    Types: 1 Glasses of wine per week    Comment: OCC   Marital Status: Married  ROS  Review of Systems  Cardiovascular:  Positive for palpitations. Negative for chest pain, dyspnea on exertion, leg swelling and syncope.  Respiratory:  Positive for snoring.   Gastrointestinal:  Negative for melena.  Objective  Blood pressure (!) 145/88, pulse 78, temperature 97.9 F (36.6 C), temperature source Temporal, resp. rate 16, height 5' (1.524 m), weight 160 lb 6.4 oz (72.8 kg), SpO2 97 %. Body mass index is 31.33 kg/m.  Vitals with BMI 04/15/2021 04/15/2021 03/09/2021  Height - 5\' 0"  5\' 0"   Weight - 160 lbs 6 oz 168 lbs 6 oz  BMI - 94.85 46.27  Systolic  836 629 476  Diastolic 88 87 86  Pulse 78 76 79     Physical Exam Constitutional:      Appearance: She is obese.  Neck:     Vascular: No carotid bruit or JVD.  Cardiovascular:     Rate and Rhythm: Normal rate and regular rhythm.     Pulses: Intact distal pulses.     Heart sounds: Normal heart sounds. No murmur heard.   No gallop.  Pulmonary:     Effort: Pulmonary effort is normal.     Breath sounds: Normal breath sounds.  Abdominal:     General: Bowel sounds are normal.     Palpations: Abdomen is soft.  Musculoskeletal:        General: No swelling.     Laboratory examination:   No results for input(s): NA, K, CL, CO2, GLUCOSE, BUN, CREATININE, CALCIUM, GFRNONAA, GFRAA in the last 8760 hours.  CrCl cannot be calculated (Patient's most recent lab result is older than the maximum 21 days allowed.).  CMP Latest Ref Rng & Units  04/07/2020  Glucose 70 - 99 mg/dL 75  BUN 6 - 23 mg/dL 13  Creatinine 0.40 - 1.20 mg/dL 0.88  Sodium 135 - 145 mEq/L 139  Potassium 3.5 - 5.1 mEq/L 4.0  Chloride 96 - 112 mEq/L 102  CO2 19 - 32 mEq/L 29  Calcium 8.4 - 10.5 mg/dL 9.3  Total Protein 6.0 - 8.3 g/dL 6.9  Total Bilirubin 0.2 - 1.2 mg/dL 0.4  Alkaline Phos 39 - 117 U/L 107  AST 0 - 37 U/L 19  ALT 0 - 35 U/L 22   CBC Latest Ref Rng & Units 04/07/2020  WBC 4.0 - 10.5 K/uL 8.6  Hemoglobin 12.0 - 15.0 g/dL 15.2(H)  Hematocrit 36.0 - 46.0 % 45.3  Platelets 150.0 - 400.0 K/uL 304.0    External labs:   Cholesterol, total 262.000 M 04/14/2020 HDL 64.000 MG 04/14/2020 LDL 177.000 M 04/14/2020 Triglycerides 120.000 M 04/14/2020  A1C 5.400 % 04/14/2020 TSH 1.890 10/02/2017  Hemoglobin 15.200 g/d 04/07/2020 Platelets 304.000 K/ 04/07/2020  Creatinine, Serum 1.030 MG/ 04/14/2020 Potassium 4.000 mEq 04/07/2020 ALT (SGPT) 24.000 IU/ 04/14/2020  Medications and allergies   Allergies  Allergen Reactions   Bee Venom Anaphylaxis   Ciprofloxacin Hives and Shortness Of Breath   Macrobid [Nitrofurantoin] Anaphylaxis   Metrizamide Anaphylaxis   Penicillins Hives, Shortness Of Breath and Other (See Comments)    Has patient had a PCN reaction causing immediate rash, facial/tongue/throat swelling, SOB or lightheadedness with hypotension: Yes Has patient had a PCN reaction causing severe rash involving mucus membranes or skin necrosis: No Has patient had a PCN reaction that required hospitalization: No Has patient had a PCN reaction occurring within the last 10 years: No If all of the above answers are "NO", then may proceed with Cephalosporin use.   Red Dye Hives and Shortness Of Breath   Sulfa Antibiotics Hives and Shortness Of Breath   Diphenhydramine     Other reaction(s): red dye only   Iodinated Diagnostic Agents Hives   Iodine     Other reaction(s): Unknown   Oxycodone Nausea And Vomiting   Ketoconazole Rash     Medication  prior to this encounter:   Outpatient Medications Prior to Visit  Medication Sig Dispense Refill   albuterol (VENTOLIN HFA) 108 (90 Base) MCG/ACT inhaler Inhale 1 puff into the lungs as needed.     EPINEPHrine (EPIPEN 2-PAK) 0.3 mg/0.3 mL IJ SOAJ injection Inject 0.3 mLs (0.3  mg total) into the muscle once as needed (for severe allergic reaction). CAll 911 immediately if you have to use this medicine 1 Device 0   magnesium gluconate (MAGONATE) 500 MG tablet Take 500 mg by mouth daily as needed (constipation).     NON FORMULARY hormone replacement pellets injected subcutaneous every 4 to 5 months     No facility-administered medications prior to visit.    Medication list after today's encounter   Current Outpatient Medications  Medication Instructions   albuterol (VENTOLIN HFA) 108 (90 Base) MCG/ACT inhaler 1 puff, Inhalation, As needed   EPINEPHrine (EPIPEN 2-PAK) 0.3 mg, Intramuscular, Once PRN, CAll 911 immediately if you have to use this medicine   magnesium gluconate (MAGONATE) 500 mg, Oral, Daily PRN   NON FORMULARY hormone replacement pellets injected subcutaneous every 4 to 5 months    verapamil (CALAN-SR) 180 mg, Oral, Daily at bedtime   Radiology:   Chest x-ray 04/08/2020: The heart size and mediastinal contours are within normal limits. Both lungs are clear. The visualized skeletal structures are unremarkable.  Cardiac Studies:   Treadmill stress test. 08/19/2013 : The patient exercised according to the [facility]   protocol, Total time recorded  9 Min. 1 sec. achieving a max heart rate of 151  which was  88% of MPHR for age and  10.0 METS of work. Baseline NIBP was 122/84. Peak NIBP was 160/80 Max Sysp was: 160 MaxDiasp was: 84. The baseline ECG showed NSR, Normal ECG. During exercise there was no ST-T changes of ischemia. Symptoms: THR achieved. Arrhythmia: None.  Indications: Shortness of breath. Conclusions: Negative for ischemia.  Evaluate for non cardiac etiology for  dyspnea. Continue primary prevention.  Coronary calcium score 03/26/2021: Total score of 0.  Visualized ascending and descending thoracic aorta normal. The visualized liver demonstrates multiple irregular hypodense structures measuring under 1 cm and scattered throughout right and left lobes. 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 These likely do not require further imaging follow-up unless there is a history of previous malignancy.    PCV ECHOCARDIOGRAM COMPLETE 03/19/2021  Narrative Echocardiogram 03/19/2021: Left ventricle cavity is normal in size and wall thickness. Normal global wall motion. Normal LV systolic function with EF 58%. Normal diastolic filling pattern. Moderate (Grade II) mitral regurgitation. Mild to moderate tricuspid regurgitation. No evidence of pulmonary hypertension. Previous study in 2015 reported mild MR.  Zio Patch Extended out patient EKG monitoring 7 days starting 03/09/2021: Predominant rhythm is normal sinus rhythm.  Minimum heart rate 43, maximum heart rate 127 bpm.  There were rare PVCs and PACs.  Diary entry correlated with PVCs.  Triggered event correlated with PAC and PVC.  There was no atrial fibrillation, there were no complex ventricular arrhythmias, no heart block     EKG:   EKG 03/09/2021: Normal sinus rhythm at rate of 70 bpm, normal axis.  No evidence of ischemia, normal EKG.      Assessment     ICD-10-CM   1. Rapid palpitations  R00.2 verapamil (CALAN-SR) 180 MG CR tablet    2. Elevated BP without diagnosis of hypertension  R03.0 verapamil (CALAN-SR) 180 MG CR tablet    3. Pure hypercholesterolemia  E78.00       There are no discontinued medications.   Meds ordered this encounter  Medications   verapamil (CALAN-SR) 180 MG CR tablet    Sig: Take 1 tablet (180 mg total) by mouth at bedtime.    Dispense:  90  tablet    Refill:  1    No orders of the defined types were placed in  this encounter.  Recommendations:   Tyan Lasure is a 60 y.o. Caucasian female patient with OSA on CPAP, presents for f/u of near syncope and rapid palpitations. Symptoms started about 2 months ago. Her past medical history is also significant for hyperlipidemia.  Patient was seen by me about 6 weeks ago for these episodes of near syncope, rapid palpitations.  She has not had any further episodes, syncope I felt was probably vasovagal.  Her rapid palpitations symptoms suggest PSVT.  She wore extended EKG monitoring, unfortunately she did not have any episodes and only had few brief episodes that are PACs and PVCs.  Her symptoms of rapid onset of palpitations and onset and offset suggest PSVT.  Her blood pressure has been elevated, she has been monitoring this at home.  I suspect this is also due to her recent weight gain after the ankle fracture and she is on the verge of losing weight she has lost about 8 pounds since last office visit with me.  Both due to suspicion for PSVT and also due to elevated blood pressure, I will start her on verapamil 180 mg p.o. daily, see how she does and watchful waiting is indicated with regard to episodes of rapid palpitations.  Other option would be to place a implantable loop recorder.  She also has marked hyperlipidemia.  LDL is markedly elevated.  She needs routine labs to relook at LDL.  Fortunately her coronary calcium score is 0 however if LDL continues to remain >233, would certainly consider addition of simvastatin or Crestor 40 mg / 20 mg respectively.  I will send a copy of my note to Dr. Kathyrn Lass and also to Dr. Radene Journey.  Would like to see him back in 6 months for follow-up.  She can return to full activity without any limitations.   Adrian Prows, MD, [facility] 04/15/2021, 3:06 PM Office: [phone]

## 2021-10-09 ENCOUNTER — Other Ambulatory Visit: Payer: Self-pay | Admitting: Cardiology

## 2021-10-09 DIAGNOSIS — R030 Elevated blood-pressure reading, without diagnosis of hypertension: Secondary | ICD-10-CM

## 2021-10-09 DIAGNOSIS — R002 Palpitations: Secondary | ICD-10-CM

## 2021-10-13 ENCOUNTER — Ambulatory Visit: Payer: BC Managed Care – PPO | Admitting: Cardiology

## 2021-10-13 ENCOUNTER — Encounter: Payer: Self-pay | Admitting: Cardiology

## 2021-10-13 VITALS — BP 130/81 | HR 76 | Temp 98.0°F | Resp 16 | Ht 59.0 in | Wt 146.0 lb

## 2021-10-13 DIAGNOSIS — E785 Hyperlipidemia, unspecified: Secondary | ICD-10-CM

## 2021-10-13 DIAGNOSIS — R002 Palpitations: Secondary | ICD-10-CM

## 2021-10-13 DIAGNOSIS — R030 Elevated blood-pressure reading, without diagnosis of hypertension: Secondary | ICD-10-CM

## 2021-10-13 NOTE — Progress Notes (Signed)
? ?Primary Physician/Referring:  Kathyrn Lass, MD ? ?Patient ID: Darlene Harvey, female    DOB: Nov 27, 1960, 61 y.o.   MRN: [id number] ? ?Chief Complaint  ?Patient presents with  ? Palpitations  ? Follow-up  ?  6 months  ? ?HPI:   ? ?Darlene Harvey  is a 61 y.o. Caucasian female patient with prior history of OSA discontinued being on CPAP about 4 to 5 years ago and could not requalify for CPAP machine due to low score, prehypertension, symptoms suggestive of PSVT and hypercholesterolemia presents here for follow-up. ? ?Patient was moderately obese and presently on bariatric program and has lost about 40 pounds in weight.  She remains asymptomatic and has had very brief episodes of palpitations occasionally.   ? ?Past Medical History:  ?Diagnosis Date  ? Asthma   ? Closed left ankle fracture 04/03/2018  ? Complication of anesthesia   ? slow to awaken  ? Constipation   ? Family history of adverse reaction to anesthesia   ? father- [place] difficulty to wake up  ? Head injury with loss of consciousness ([place])   ? Heart murmur   ? was evaluated by Dr Nadyne Coombes  ? High cholesterol   ? History of hiatal hernia   ? Melanoma ([place])   ? OSA (obstructive sleep apnea) 07/02/2013  ? CPAP  ? Pneumonia   ? PONV (postoperative nausea and vomiting)   ? Retinal tear   ? Vasomotor instability   ? Hypotension with exercise  ? ? ?Family History  ?Problem Relation Age of Onset  ? Asthma Mother   ? Allergies Mother   ? Cancer Mother   ?     stomach  ? Cancer - Prostate Father   ? Cancer - Prostate Brother 32  ?  ?Social History  ? ?Tobacco Use  ? Smoking status: Never  ? Smokeless tobacco: Never  ?Substance Use Topics  ? Alcohol use: Yes  ?  Alcohol/week: 1.0 standard drink  ?  Types: 1 Glasses of wine per week  ?  Comment: OCC  ? ?Marital Status: Married  ?ROS  ?Review of Systems  ?Cardiovascular:  Positive for palpitations. Negative for chest pain, dyspnea on exertion, leg swelling and syncope.  ?Respiratory:  Negative for snoring.   ?Objective   ?Blood pressure 130/81, pulse 76, temperature 98 ?F (36.7 ?C), temperature source Temporal, resp. rate 16, height '4\' 11"'  (1.499 m), weight 146 lb (66.2 kg), SpO2 96 %. Body mass index is 29.49 kg/m?.  ? ?  10/13/2021  ? 11:03 AM 04/15/2021  ?  2:10 PM 04/15/2021  ?  2:08 PM  ?Vitals with BMI  ?Height '4\' 11"'   '5\' 0"'   ?Weight 146 lbs  160 lbs 6 oz  ?BMI 29.47  31.33  ?Systolic 287 867 672  ?Diastolic 81 88 87  ?Pulse 76 78 76  ?  ? Physical Exam ?Neck:  ?   Vascular: No carotid bruit or JVD.  ?Cardiovascular:  ?   Rate and Rhythm: Normal rate and regular rhythm.  ?   Pulses: Intact distal pulses.  ?   Heart sounds: Normal heart sounds. No murmur heard. ?  No gallop.  ?Pulmonary:  ?   Effort: Pulmonary effort is normal.  ?   Breath sounds: Normal breath sounds.  ?Abdominal:  ?   General: Bowel sounds are normal.  ?   Palpations: Abdomen is soft.  ?Musculoskeletal:  ?   Right lower leg: No edema.  ?   Left lower leg: No  edema.  ?  ?Laboratory examination:  ?  ?External labs:  ? ?Labs 09/08/2021: ? ?TSH 1.660, normal.  A1c 4.3%. ? ?BUN 12, creatinine 0.78, EGFR 81 mL, LFTs normal. ? ?[id number] ? ?Cholesterol, total 262.000 M 04/14/2020 ?HDL 64.000 MG 04/14/2020 ?LDL 177.000 M 04/14/2020 ?Triglycerides 120.000 M 04/14/2020 ? ?Medications and allergies  ? ?Allergies  ?Allergen Reactions  ? Bee Venom Anaphylaxis  ? Ciprofloxacin Hives and Shortness Of Breath  ? Macrobid [Nitrofurantoin] Anaphylaxis  ? Metrizamide Anaphylaxis  ? Penicillins Hives, Shortness Of Breath and Other (See Comments)  ?  Has patient had a PCN reaction causing immediate rash, facial/tongue/throat swelling, SOB or lightheadedness with hypotension: Yes ?Has patient had a PCN reaction causing severe rash involving mucus membranes or skin necrosis: No ?Has patient had a PCN reaction that required hospitalization: No ?Has patient had a PCN reaction occurring within the last 10 years: No ?If all of the above answers are "NO", then may proceed with Cephalosporin use.   ? Red Dye Hives and Shortness Of Breath  ? Sulfa Antibiotics Hives and Shortness Of Breath  ? Diphenhydramine   ?  Other reaction(s): red dye only  ? Iodinated Contrast Media Hives  ? Iodine   ?  Other reaction(s): Unknown  ? Oxycodone Nausea And Vomiting  ? Ketoconazole Rash  ?  ? ?Medications  ? ? ?Current Outpatient Medications:  ?  albuterol (VENTOLIN HFA) 108 (90 Base) MCG/ACT inhaler, Inhale 1 puff into the lungs as needed., Disp: , Rfl:  ?  EPINEPHrine (EPIPEN 2-PAK) 0.3 mg/0.3 mL IJ SOAJ injection, Inject 0.3 mLs (0.3 mg total) into the muscle once as needed (for severe allergic reaction). CAll 911 immediately if you have to use this medicine, Disp: 1 Device, Rfl: 0 ?  magnesium gluconate (MAGONATE) 500 MG tablet, Take 500 mg by mouth daily as needed (constipation)., Disp: , Rfl:  ?  NALTREXONE HCL PO, Take 9 mg by mouth., Disp: , Rfl:  ?  NON FORMULARY, hormone replacement pellets injected subcutaneous every 4 to 5 months, Disp: , Rfl:  ?  verapamil (CALAN-SR) 180 MG CR tablet, TAKE 1 TABLET BY MOUTH AT BEDTIME, Disp: 90 tablet, Rfl: 1  ?  ? ?Radiology:  ? ?Chest x-ray 04/08/2020: ?The heart size and mediastinal contours are within normal limits. ?Both lungs are clear. The visualized skeletal structures are ?unremarkable. ? ?Cardiac Studies:  ? ?Treadmill stress test. 08/19/2013 : ?The patient exercised according to the [facility]   protocol, Total time recorded  9 Min. 1 sec. achieving a max heart rate of 151  which was  88% of MPHR for age and  10.0 METS of work. Baseline NIBP was 122/84. Peak NIBP was 160/80 Max Sysp was: 160 MaxDiasp was: 84. The baseline ECG showed NSR, Normal ECG. During exercise there was no ST-T changes of ischemia. Symptoms: THR achieved. Arrhythmia: None.  ?Indications: Shortness of breath. ?Conclusions: Negative for ischemia.  Evaluate for non cardiac etiology for dyspnea. Continue primary prevention. ? ?Coronary calcium score 03/26/2021: ?Total score of 0.  Visualized ascending and  descending thoracic aorta normal. ?The visualized liver demonstrates multiple irregular hypodense structures measuring under 1 cm and scattered throughout right and left lobes. 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 These likely do not require further imaging follow-up unless there is a history of previous malignancy.   ? ?PCV ECHOCARDIOGRAM COMPLETE 03/19/2021  ?Left ventricle cavity is normal in size and wall thickness. Normal global wall motion. Normal  LV systolic function with EF 58%. Normal diastolic filling pattern. ?Moderate (Grade II) mitral regurgitation. ?Mild to moderate tricuspid regurgitation. ?No evidence of pulmonary hypertension. ?Previous study in 2015 reported mild MR. ? ?Zio Patch Extended out patient EKG monitoring 7 days starting 03/09/2021: ?Predominant rhythm is normal sinus rhythm.  Minimum heart rate 43, maximum heart rate 127 bpm.  There were rare PVCs and PACs.  Diary entry correlated with PVCs.  Triggered event correlated with PAC and PVC.  There was no atrial fibrillation, there were no complex ventricular arrhythmias, no heart block ?   ? ?EKG:  ? ?EKG 10/13/2021: Normal sinus rhythm at the rate of 67 bpm, normal EKG. no change from 03/09/2021.   ? ?Assessment  ? ?  ICD-10-CM   ?1. Rapid palpitations  R00.2 EKG 12-Lead  ?  ?2. Prehypertension  R03.0   ?  ?3. Mild hyperlipidemia  E78.5   ?  ?  ?There are no discontinued medications. ?  ?No orders of the defined types were placed in this encounter. ? ? ?Orders Placed This Encounter  ?Procedures  ? EKG 12-Lead  ? ?Recommendations:  ? ?Darlene Harvey is a 61 y.o. Caucasian female patient with prior history of OSA discontinued being on CPAP about 4 to 5 years ago and could not requalify for CPAP machine due to low score, prehypertension, symptoms suggestive of PSVT and hypercholesterolemia presents here for follow-up. ? ?Patient was moderately obese and presently on bariatric program  and has lost about 40 pounds in weight.  She remains asymptomatic and has had very brief episodes of palpitations occasionally.  No dizziness or syncope.  Blood pressure has been well controlled and symptoms

## 2022-08-29 ENCOUNTER — Other Ambulatory Visit: Payer: Self-pay | Admitting: Pain Medicine

## 2022-08-29 ENCOUNTER — Ambulatory Visit
Admission: RE | Admit: 2022-08-29 | Discharge: 2022-08-29 | Disposition: A | Discharge status: Home / Self Care | Payer: No Typology Code available for payment source | Source: Ambulatory Visit | Attending: Pain Medicine | Admitting: Pain Medicine

## 2022-08-29 DIAGNOSIS — J4540 Moderate persistent asthma, uncomplicated: Secondary | ICD-10-CM

## 2023-04-21 IMAGING — CT CT CARDIAC CORONARY ARTERY CALCIUM SCORE
3 series · 14 of 20 positions shown, 16 images · non-contrast
Comparison: None.

CLINICAL DATA: 52-year-old Caucasian female with history of
hyperlipidemia.

EXAM:
CT CARDIAC CORONARY ARTERY CALCIUM SCORE
TECHNIQUE: Non-contrast imaging through the heart was performed using
prospective ECG gating. Image post processing was performed on an
independent workstation, allowing for quantitative analysis of the
heart and coronary arteries. Note that this exam targets the heart
and the chest was not imaged in its entirety.

[Series 2: calcium scoring 2.00 qr36 bestdiast 67% hrt calciu · axial · 0.33mm/px · z∈[+1776,+1860]mm · 4 of 70 slices shown]
[im 14/70  vessel]
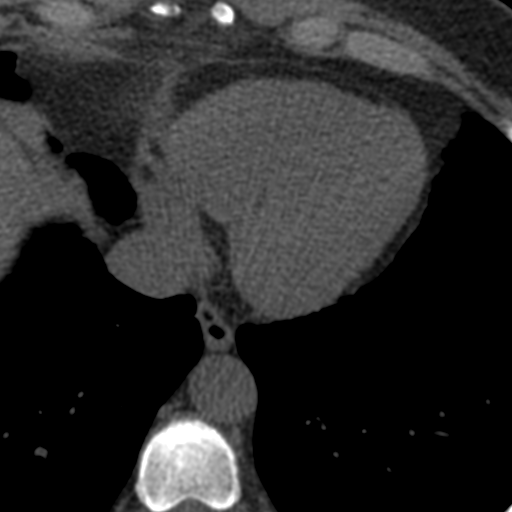
[im 28/70  vessel]
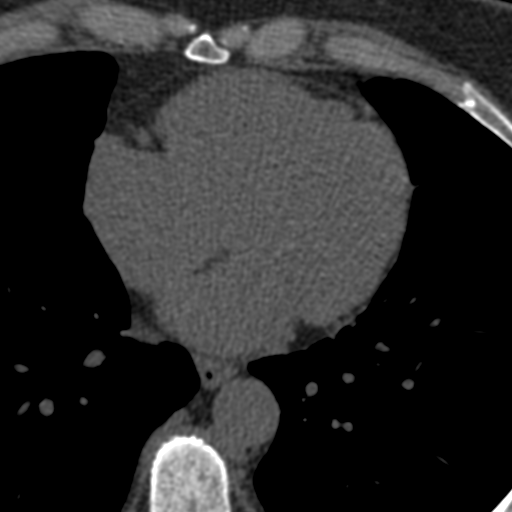
[im 42/70  vessel]
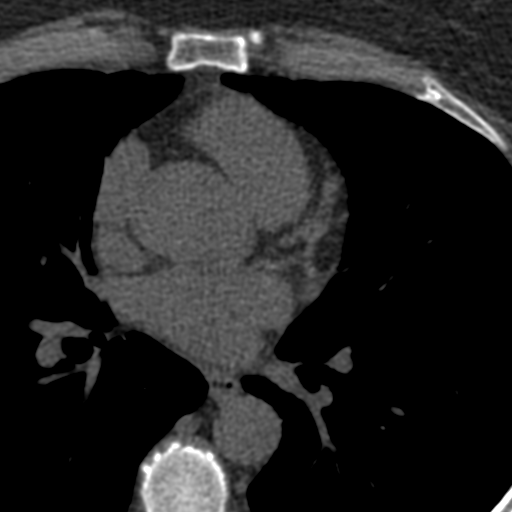
[im 56/70  vessel]
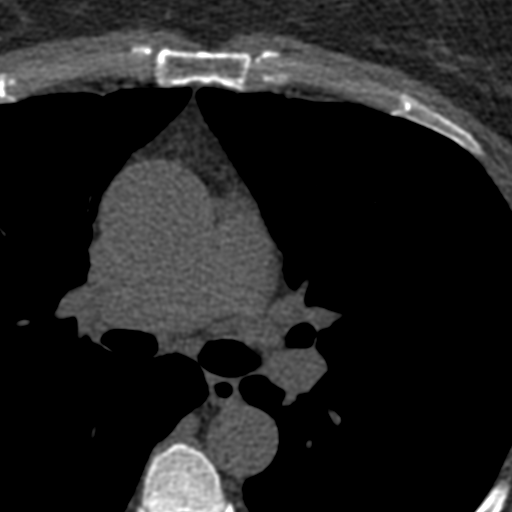

[Series 3: calcium scoring 2.00 br40 bestdiast 67% axial · axial · 0.55mm/px · z∈[+1772,+1864]mm · 5 of 70 slices shown, 7 images]
[im 12/70  vessel]
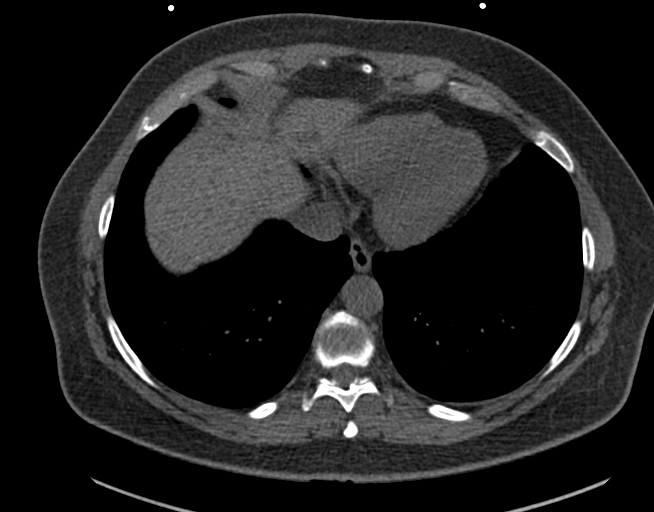
[im 12/70  lung]
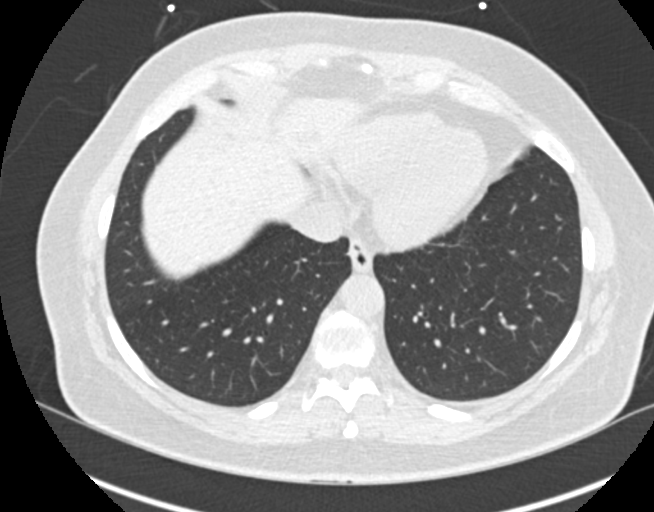
[im 24/70  vessel]
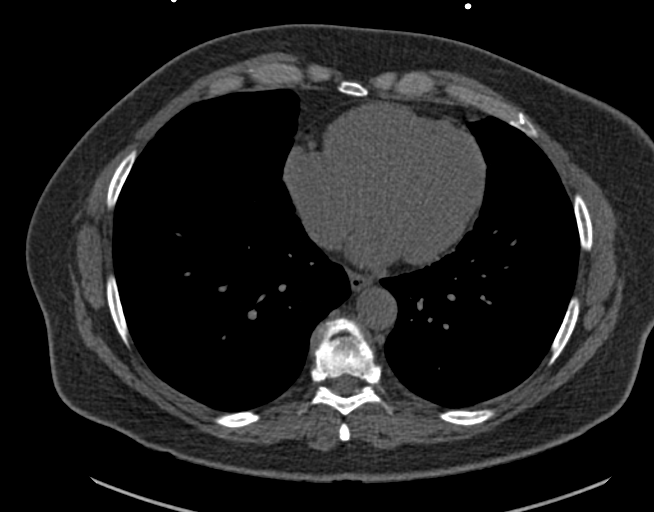
[im 35/70  vessel]
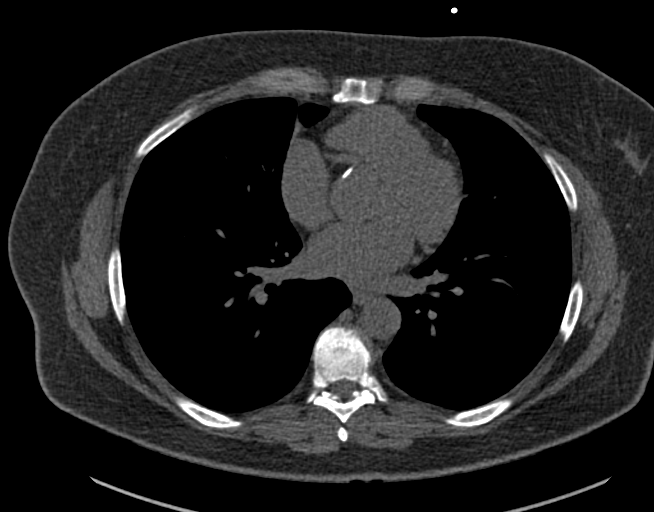
[im 47/70  vessel]
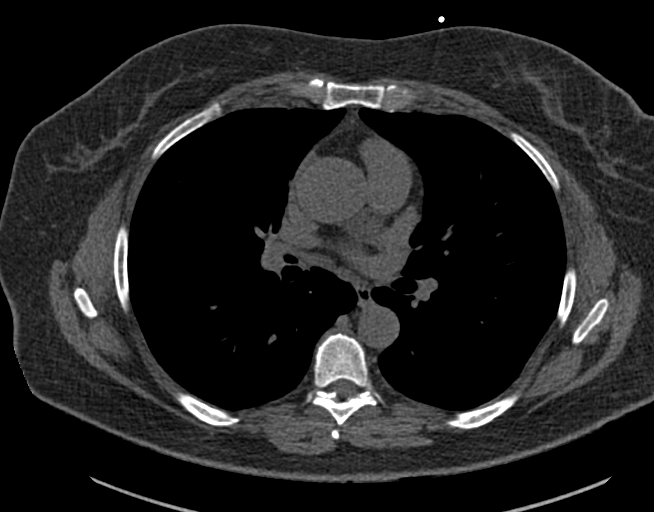
[im 58/70  vessel]
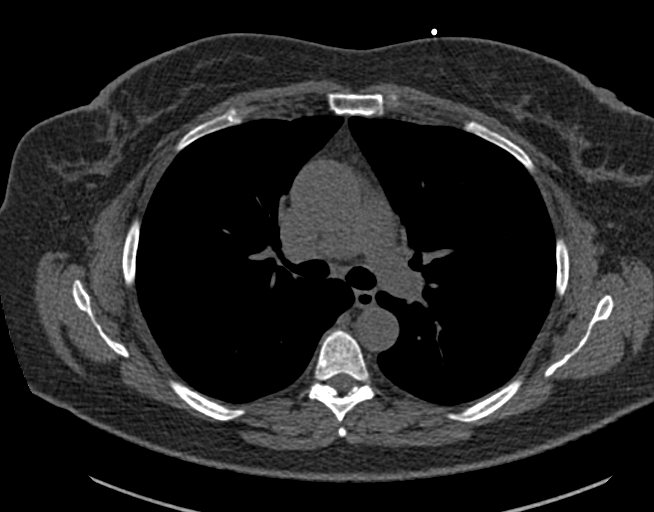
[im 58/70  lung]
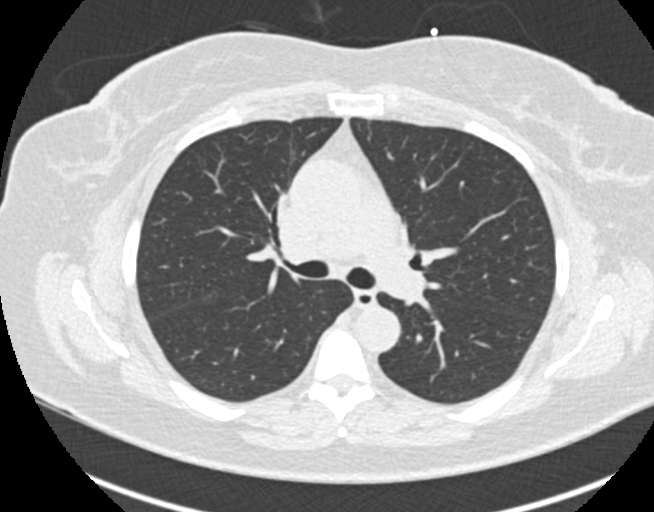

[Series 9: calcium scoring 2.00 br60 bestdiast 67% lungs · axial · 0.55mm/px · z∈[+1772,+1864]mm · 5 of 70 slices shown]
[im 12/70  vessel]
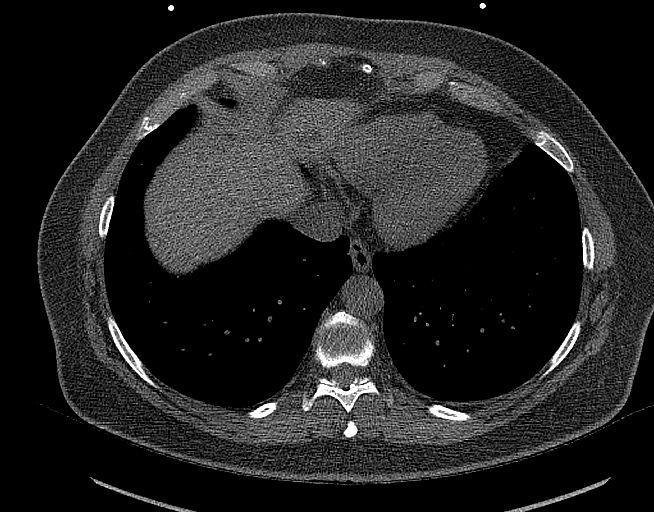
[im 24/70  vessel]
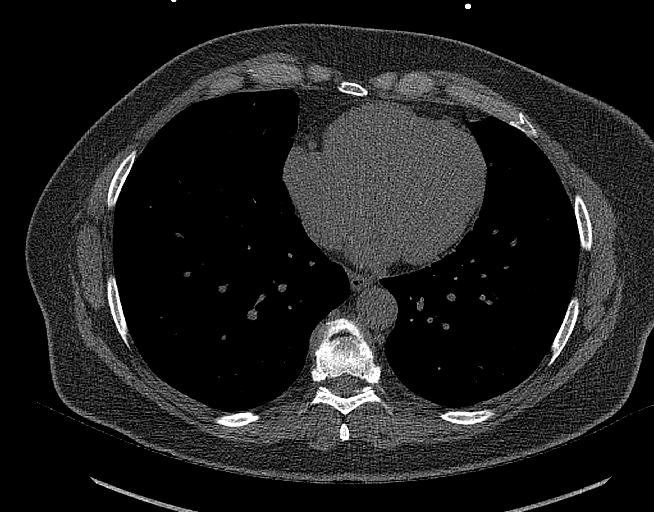
[im 35/70  vessel]
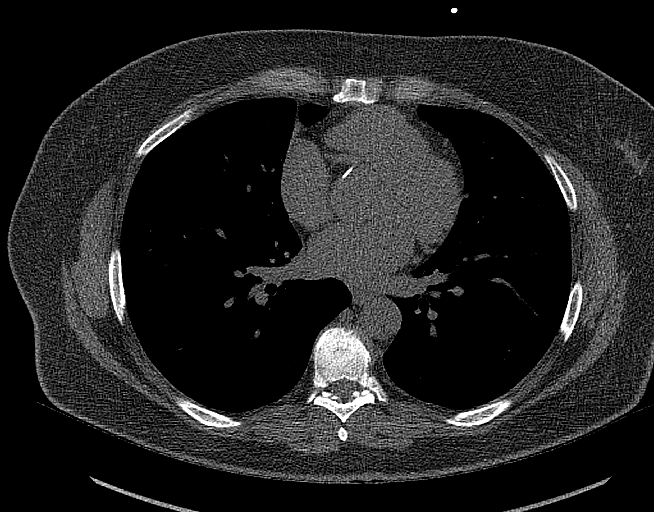
[im 47/70  vessel]
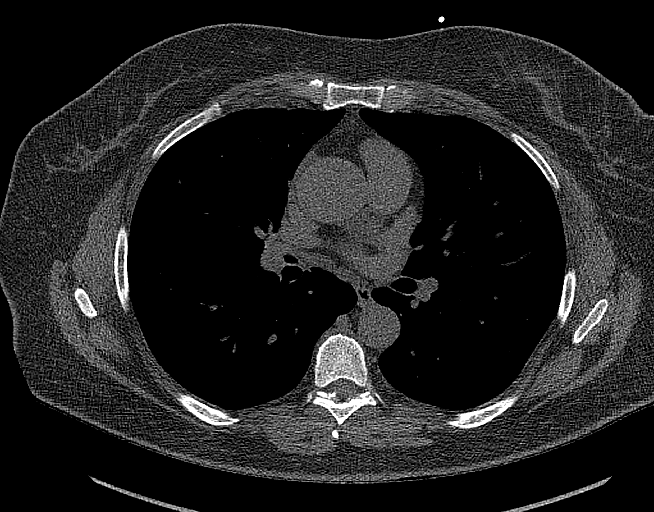
[im 58/70  vessel]
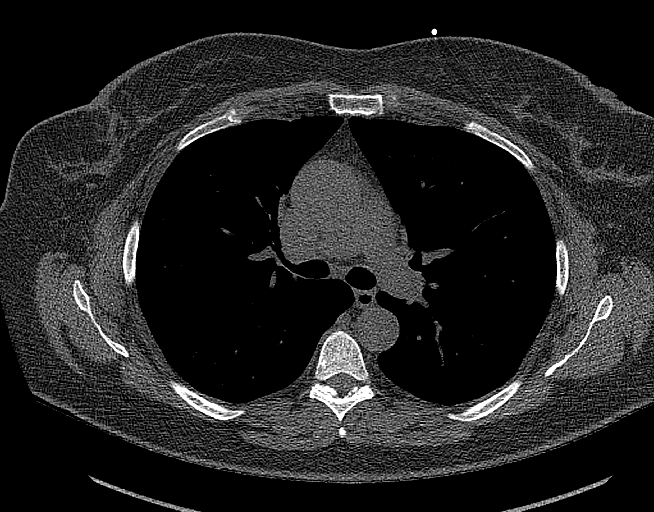

[14 of 20 positions shown; findings below may reference images not displayed]

FINDINGS: CORONARY CALCIUM SCORES:

Left Main: 0

LAD: 0

LCx: 0

RCA: 0

Total Agatston Score: 0

[HOSPITAL] percentile: 0

AORTA MEASUREMENTS:

Ascending Aorta: 36 mm

Descending Aorta: 23 mm

OTHER FINDINGS:

The heart size is within normal limits. No pericardial fluid is
identified. Visualized segments of the thoracic aorta and central
pulmonary arteries are normal in caliber. Visualized mediastinum and
hilar regions demonstrate no lymphadenopathy or masses. Visualized
lungs show no evidence of pulmonary edema, consolidation,
pneumothorax, nodule or pleural fluid. The visualized liver
demonstrates multiple irregular hypodense structures measuring under
1 cm and scattered throughout right and left lobes. While some of
these may represent small cysts, pattern is suggestive multiple
biliary hamartomas. These likely do not require further imaging
follow-up unless there is a history of previous malignancy.
Visualized bony structures are unremarkable.
IMPRESSION: 1. Coronary calcium score of 0.
2. Small irregular hypodense structures in the visualized liver
which likely represent either benign biliary hamartomas or cysts. No
further imaging workup is felt to be necessary unless there is a
history of previous malignancy.

## 2023-11-17 ENCOUNTER — Other Ambulatory Visit: Payer: Self-pay | Admitting: Obstetrics and Gynecology

## 2023-11-17 DIAGNOSIS — N643 Galactorrhea not associated with childbirth: Secondary | ICD-10-CM

## 2023-11-17 DIAGNOSIS — R7989 Other specified abnormal findings of blood chemistry: Secondary | ICD-10-CM

## 2023-11-24 ENCOUNTER — Ambulatory Visit
Admission: RE | Admit: 2023-11-24 | Discharge: 2023-11-24 | Disposition: A | Discharge status: Home / Self Care | Source: Ambulatory Visit | Attending: Obstetrics and Gynecology | Admitting: Obstetrics and Gynecology

## 2023-11-24 DIAGNOSIS — R7989 Other specified abnormal findings of blood chemistry: Secondary | ICD-10-CM

## 2023-11-24 MED ORDER — GADOPICLENOL 0.5 MMOL/ML IV SOLN
7.0000 mL | Freq: Once | INTRAVENOUS | Status: AC | PRN
  Administered 2023-11-24: 7 mL via INTRAVENOUS

## 2023-12-07 ENCOUNTER — Encounter: Payer: Self-pay | Admitting: Neurology

## 2023-12-13 ENCOUNTER — Ambulatory Visit
Admission: RE | Admit: 2023-12-13 | Discharge: 2023-12-13 | Disposition: A | Discharge status: Home / Self Care | Source: Ambulatory Visit | Attending: Obstetrics and Gynecology | Admitting: Obstetrics and Gynecology

## 2023-12-13 ENCOUNTER — Inpatient Hospital Stay
Admission: RE | Admit: 2023-12-13 | Discharge: 2023-12-13 | Discharge status: Home / Self Care | Source: Ambulatory Visit | Attending: Obstetrics and Gynecology | Admitting: Obstetrics and Gynecology

## 2023-12-13 DIAGNOSIS — N643 Galactorrhea not associated with childbirth: Secondary | ICD-10-CM

## 2023-12-21 ENCOUNTER — Encounter

## 2023-12-21 ENCOUNTER — Other Ambulatory Visit

## 2023-12-26 ENCOUNTER — Ambulatory Visit: Admitting: Neurology

## 2023-12-26 ENCOUNTER — Encounter: Payer: Self-pay | Admitting: Neurology

## 2023-12-26 VITALS — BP 130/78 | HR 78 | Ht 60.0 in | Wt 158.0 lb

## 2023-12-26 DIAGNOSIS — G44209 Tension-type headache, unspecified, not intractable: Secondary | ICD-10-CM | POA: Diagnosis not present

## 2023-12-26 NOTE — Patient Instructions (Addendum)
 Please follow-up with your primary care provider for blood pressure  Limit tylenol  or ibuprofen to twice per week

## 2023-12-26 NOTE — Progress Notes (Signed)
 [facility] Clinic Note - Initial Visit   Date: 12/26/2023   Darlene Harvey MRN: [id number] DOB: [birth year]   Dear Dr. Cleotilde:  Thank you for your kind referral of Darlene Harvey for consultation of abnormal MRI brain. Although her history is well known to you, please allow us  to reiterate it for the purpose of our medical record. The patient was accompanied to the clinic by self.    Darlene Harvey is a 63 y.o. right-handed female presenting for evaluation of abnormal MRI brain.   IMPRESSION/PLAN: Chronic white matter changes on MRI brain are normal age-relating findings.  MRI brain was personally viewed with patient and she was reassured that I did not see anything worrisome or any explanation for galactorrhea stemming from central nervous system pathology.  Tension headaches are new over the past few weeks.  No migrainous features.  Her blood pressure is elevated and has been higher which can contribute to her headaches. I recommend that she monitor blood pressure at home and follow-up with PCP.  For severe headache, she can take ibuprofen or tylenol , but limit to twice per week to prevent rebound headaches.   Return to clinic as needed  ------------------------------------------------------------- History of present illness: She had MRI brain as part of evaluation for galactorrhea which shows scattered white matter changes and was referred for evaluation of this.  No abnormalities of the pituitary gland were noted.  For the past two weeks, she has been having dull headaches across the eyes.  She takes tylenol  twice per week, which helps.  Her blood pressure has been higher than normal recently.  She has been managing a lot - retired a few weeks ago, taking care of her husband who had knee surgery, daughter has pancreatic cancer, and the heavy rains is causing her driveway to slide causing structural problems on her home.    Out-side paper records, electronic  medical record, and images have been reviewed where available and summarized as:  MRI brain wwo contrast 12/06/2023: 1. No mass or abnormal enhancement of the pituitary gland. 2. Unchanged scattered foci of T2 hyperintensity in the cerebral white matter with frontal predominance, nonspecific.    Past Medical History:  Diagnosis Date   Asthma    Closed left ankle fracture 04/03/2018   Complication of anesthesia    slow to awaken   Constipation    Family history of adverse reaction to anesthesia    father- [place] difficulty to wake up   Head injury with loss of consciousness (HCC)    Heart murmur    was evaluated by Dr Margaretann   High cholesterol    History of hiatal hernia    Melanoma (HCC)    OSA (obstructive sleep apnea) 07/02/2013   CPAP   Pneumonia    PONV (postoperative nausea and vomiting)    Retinal tear    Vasomotor instability    Hypotension with exercise    Past Surgical History:  Procedure Laterality Date   ABDOMINAL HYSTERECTOMY     COLONOSCOPY W/ POLYPECTOMY     NERVE TRANSFER     ELBOW   ORIF ANKLE FRACTURE Left 04/03/2018   ORIF ANKLE FRACTURE Left 04/03/2018   Procedure: OPEN REDUCTION INTERNAL FIXATION (ORIF) OF LEFT  ANKLE FRACTURE;  Surgeon: Elsa Lonni SAUNDERS, MD;  Location: [facility] OR;  Service: Orthopedics;  Laterality: Left;   Rectal vaginal reconstruction     SHOULDER SURGERY Left    reconstration   WRIST SURGERY Right    fracture  Medications:  Outpatient Encounter Medications as of 12/26/2023  Medication Sig Note   albuterol (VENTOLIN HFA) 108 (90 Base) MCG/ACT inhaler Inhale 1 puff into the lungs as needed.    EPINEPHrine  (EPIPEN  2-PAK) 0.3 mg/0.3 mL IJ SOAJ injection Inject 0.3 mLs (0.3 mg total) into the muscle once as needed (for severe allergic reaction). CAll 911 immediately if you have to use this medicine    magnesium gluconate (MAGONATE) 500 MG tablet Take 500 mg by mouth daily as needed (constipation).    NON FORMULARY hormone replacement  pellets injected subcutaneous every 4 to 5 months   HRT COMPOUNDED PELLETS 04/02/2018: Injected at [facility] medical [phone]    progesterone (PROMETRIUM) 100 MG capsule Take 50 mg by mouth. Once a week 50 mg    NALTREXONE HCL PO Take 9 mg by mouth. (Patient not taking: Reported on 12/26/2023)    verapamil  (CALAN -SR) 180 MG CR tablet TAKE 1 TABLET BY MOUTH AT BEDTIME    No facility-administered encounter medications on file as of 12/26/2023.    Allergies:  Allergies  Allergen Reactions   Bee Venom Anaphylaxis   Ciprofloxacin Hives and Shortness Of Breath   Macrobid [Nitrofurantoin] Anaphylaxis   Metrizamide Anaphylaxis   Penicillins Hives, Shortness Of Breath and Other (See Comments)    Has patient had a PCN reaction causing immediate rash, facial/tongue/throat swelling, SOB or lightheadedness with hypotension: Yes Has patient had a PCN reaction causing severe rash involving mucus membranes or skin necrosis: No Has patient had a PCN reaction that required hospitalization: No Has patient had a PCN reaction occurring within the last 10 years: No If all of the above answers are NO, then may proceed with Cephalosporin use.   Red Dye #40 (Allura Red) Hives and Shortness Of Breath   Sulfa Antibiotics Hives and Shortness Of Breath   Diphenhydramine      Other reaction(s): red dye only   Iodinated Contrast Media Hives   Iodine     Other reaction(s): Unknown   Oxycodone  Nausea And Vomiting   Ketoconazole Rash    Family History: Family History  Problem Relation Age of Onset   Asthma Mother    Allergies Mother    Cancer Mother        stomach   Cancer - Prostate Father    Cancer - Prostate Brother 27   Cancer Daughter        presented in her thyroid and has spread.    Social History: Social History   Tobacco Use   Smoking status: Never   Smokeless tobacco: Never  Vaping Use   Vaping status: Never Used  Substance Use Topics   Alcohol use: Yes    Alcohol/week: 1.0  standard drink of alcohol    Types: 1 Glasses of wine per week    Comment: OCC glass of wine once every 6 months   Drug use: No   Social History   Social History Narrative   Are you right handed or left handed? Right Handed   Are you currently employed ? No    What is your current occupation? Retired 6 weeks ago    Do you live at home alone? No    Who lives with you? Husband    What type of home do you live in: 1 story or 2 story?       Daughter is dying of cancer she is 102 yrs old         Vital Signs:  BP (!) 158/88  Pulse 78   Ht 5' (1.524 m)   Wt 158 lb (71.7 kg)   SpO2 97%   BMI 30.86 kg/m    Neurological Exam: MENTAL STATUS including orientation to time, place, person, recent and remote memory, attention span and concentration, language, and fund of knowledge is normal.  Speech is not dysarthric.  CRANIAL NERVES: II:  No visual field defects.     III-IV-VI: Pupils equal round and reactive to light.  Normal conjugate, extra-ocular eye movements in all directions of gaze.  No nystagmus.  No ptosis.   V:  Normal facial sensation.    VII:  Normal facial symmetry and movements.   VIII:  Normal hearing and vestibular function.   IX-X:  Normal palatal movement.   XI:  Normal shoulder shrug and head rotation.   XII:  Normal tongue strength and range of motion, no deviation or fasciculation.  MOTOR:  Motor strength is 5/5 throughout.  No atrophy, fasciculations or abnormal movements.  No pronator drift.   MSRs:                                           Right        Left brachioradialis 2+  2+  biceps 2+  2+  triceps 2+  2+  patellar 2+  2+  ankle jerk 2+  2+  Hoffman no  no  plantar response down  down   SENSORY:  Normal and symmetric perception of light touch, temperature and vibration.  COORDINATION/GAIT: Normal finger-to- nose-finger.  Intact rapid alternating movements bilaterally.   Gait narrow based and stable. Tandem and stressed gait intact.     Thank  you for allowing me to participate in patient's care.  If I can answer any additional questions, I would be pleased to do so.    Sincerely,    Jerritt Cardoza K. Tobie, DO

## 2024-02-12 ENCOUNTER — Ambulatory Visit: Admitting: Neurology
